# Patient Record
Sex: Male | Born: 1970 | Race: Black or African American | Hispanic: No | Marital: Single | Smoking: Former smoker
Health system: Southern US, Community
[De-identification: ages and names within clinical notes are randomized; demographics above are authoritative.]

## PROBLEM LIST (undated history)

## (undated) DIAGNOSIS — M109 Gout, unspecified: Secondary | ICD-10-CM

## (undated) DIAGNOSIS — N183 Chronic kidney disease, stage 3 unspecified: Secondary | ICD-10-CM

## (undated) DIAGNOSIS — E785 Hyperlipidemia, unspecified: Secondary | ICD-10-CM

## (undated) DIAGNOSIS — E119 Type 2 diabetes mellitus without complications: Secondary | ICD-10-CM

## (undated) DIAGNOSIS — I509 Heart failure, unspecified: Secondary | ICD-10-CM

## (undated) DIAGNOSIS — I10 Essential (primary) hypertension: Secondary | ICD-10-CM

---

## 2005-07-07 ENCOUNTER — Other Ambulatory Visit: Payer: Self-pay

## 2005-11-18 ENCOUNTER — Other Ambulatory Visit: Payer: Self-pay

## 2007-03-19 ENCOUNTER — Other Ambulatory Visit: Payer: Self-pay

## 2007-05-31 IMAGING — CR DG CHEST 1V PORT
1 series · 1 of 1 positions shown · non-contrast
Comparison: none

REASON FOR EXAM: Chest pain
COMMENTS:  LMP: (Male)

PROCEDURE:     DXR - DXR PORTABLE CHEST SINGLE VIEW  - July 07, 2005  [DATE]
RESULT:     AP view of the chest shows the lung fields to be clear. The
heart, mediastinal and osseous structures are normal in appearance.

[view not recorded]
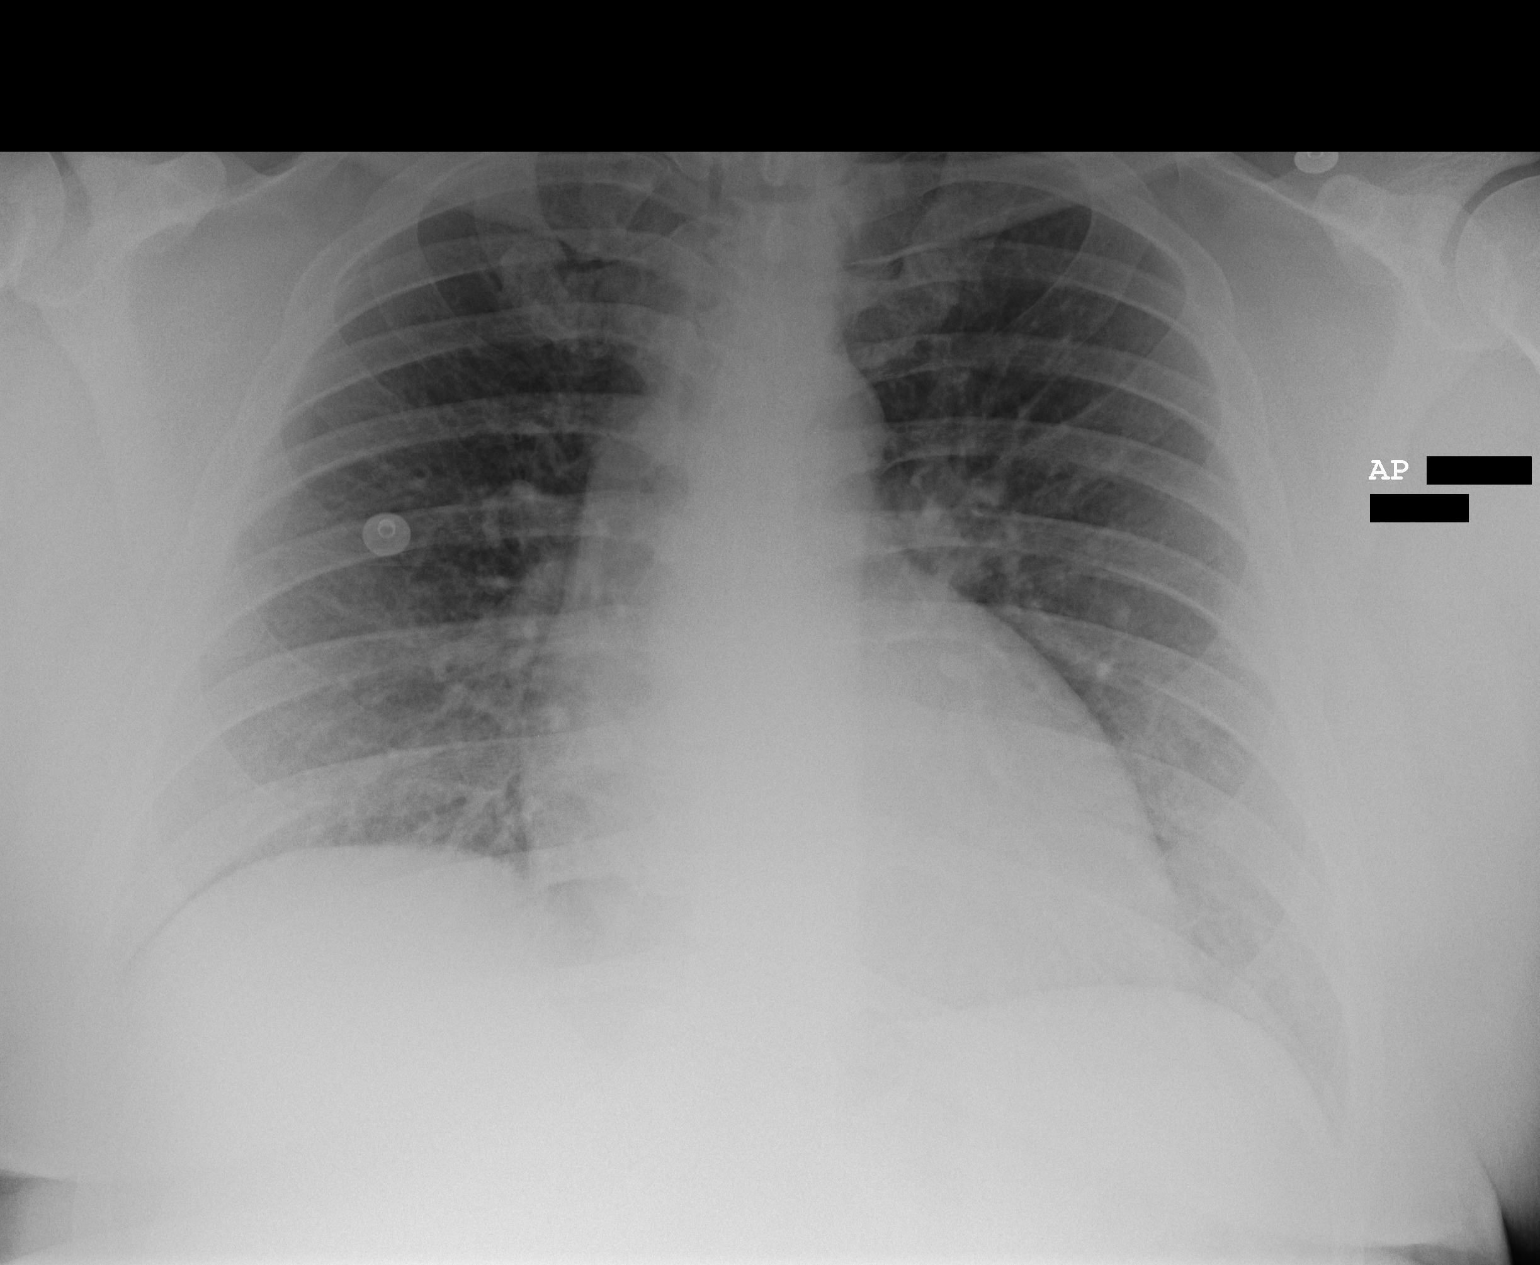

[1 of 1 positions shown; findings below may reference images not displayed]

IMPRESSION: No significant abnormalities are noted.

## 2007-11-14 ENCOUNTER — Other Ambulatory Visit: Payer: Self-pay

## 2008-01-21 ENCOUNTER — Other Ambulatory Visit: Payer: Self-pay

## 2009-07-01 ENCOUNTER — Inpatient Hospital Stay: Payer: Self-pay | Admitting: Internal Medicine

## 2009-08-04 ENCOUNTER — Ambulatory Visit: Payer: Self-pay | Admitting: Internal Medicine

## 2009-11-23 ENCOUNTER — Emergency Department: Payer: Self-pay | Admitting: Emergency Medicine

## 2010-03-25 ENCOUNTER — Emergency Department: Payer: Self-pay | Admitting: Emergency Medicine

## 2010-06-30 ENCOUNTER — Emergency Department: Payer: Self-pay | Admitting: Emergency Medicine

## 2010-12-23 ENCOUNTER — Emergency Department: Payer: Self-pay | Admitting: Emergency Medicine

## 2011-07-10 ENCOUNTER — Emergency Department: Payer: Self-pay | Admitting: *Deleted

## 2011-07-23 ENCOUNTER — Inpatient Hospital Stay: Payer: Self-pay | Admitting: Internal Medicine

## 2011-11-23 ENCOUNTER — Emergency Department: Payer: Self-pay | Admitting: Emergency Medicine

## 2011-12-18 ENCOUNTER — Observation Stay: Payer: Self-pay | Admitting: Internal Medicine

## 2012-01-24 ENCOUNTER — Emergency Department: Payer: Self-pay | Admitting: Emergency Medicine

## 2012-02-26 ENCOUNTER — Emergency Department: Payer: Self-pay | Admitting: Emergency Medicine

## 2012-08-21 ENCOUNTER — Emergency Department: Payer: Self-pay | Admitting: Emergency Medicine

## 2012-10-05 ENCOUNTER — Emergency Department: Payer: Self-pay | Admitting: Emergency Medicine

## 2012-12-31 ENCOUNTER — Inpatient Hospital Stay: Payer: Self-pay | Admitting: Internal Medicine

## 2013-01-09 ENCOUNTER — Emergency Department: Payer: Self-pay | Admitting: Emergency Medicine

## 2013-04-16 ENCOUNTER — Emergency Department: Payer: Self-pay | Admitting: Emergency Medicine

## 2013-07-08 ENCOUNTER — Inpatient Hospital Stay: Payer: Self-pay | Admitting: Internal Medicine

## 2013-09-04 ENCOUNTER — Emergency Department: Payer: Self-pay | Admitting: Emergency Medicine

## 2013-09-13 ENCOUNTER — Inpatient Hospital Stay: Payer: Self-pay | Admitting: Internal Medicine

## 2013-09-22 ENCOUNTER — Emergency Department: Payer: Self-pay | Admitting: Emergency Medicine

## 2013-09-22 ENCOUNTER — Inpatient Hospital Stay (HOSPITAL_COMMUNITY)
Admission: EM | Admit: 2013-09-22 | Discharge: 2013-09-23 | DRG: 682 | Disposition: A | Discharge status: Home / Self Care | Payer: BC Managed Care – PPO | Source: Other Acute Inpatient Hospital | Attending: Internal Medicine | Admitting: Internal Medicine

## 2013-09-22 ENCOUNTER — Encounter (HOSPITAL_COMMUNITY): Payer: Self-pay | Admitting: Internal Medicine

## 2013-09-22 DIAGNOSIS — I509 Heart failure, unspecified: Secondary | ICD-10-CM | POA: Diagnosis present

## 2013-09-22 DIAGNOSIS — N183 Chronic kidney disease, stage 3 unspecified: Secondary | ICD-10-CM | POA: Diagnosis present

## 2013-09-22 DIAGNOSIS — I161 Hypertensive emergency: Secondary | ICD-10-CM

## 2013-09-22 DIAGNOSIS — E785 Hyperlipidemia, unspecified: Secondary | ICD-10-CM | POA: Diagnosis present

## 2013-09-22 DIAGNOSIS — J9600 Acute respiratory failure, unspecified whether with hypoxia or hypercapnia: Secondary | ICD-10-CM | POA: Diagnosis present

## 2013-09-22 DIAGNOSIS — I129 Hypertensive chronic kidney disease with stage 1 through stage 4 chronic kidney disease, or unspecified chronic kidney disease: Principal | ICD-10-CM | POA: Diagnosis present

## 2013-09-22 DIAGNOSIS — Z91199 Patient's noncompliance with other medical treatment and regimen due to unspecified reason: Secondary | ICD-10-CM

## 2013-09-22 DIAGNOSIS — Z7982 Long term (current) use of aspirin: Secondary | ICD-10-CM

## 2013-09-22 DIAGNOSIS — Z9119 Patient's noncompliance with other medical treatment and regimen: Secondary | ICD-10-CM

## 2013-09-22 DIAGNOSIS — I10 Essential (primary) hypertension: Secondary | ICD-10-CM

## 2013-09-22 DIAGNOSIS — R0602 Shortness of breath: Secondary | ICD-10-CM

## 2013-09-22 DIAGNOSIS — E119 Type 2 diabetes mellitus without complications: Secondary | ICD-10-CM | POA: Diagnosis present

## 2013-09-22 DIAGNOSIS — M109 Gout, unspecified: Secondary | ICD-10-CM | POA: Diagnosis present

## 2013-09-22 DIAGNOSIS — Z794 Long term (current) use of insulin: Secondary | ICD-10-CM

## 2013-09-22 HISTORY — DX: Essential (primary) hypertension: I10

## 2013-09-22 HISTORY — DX: Chronic kidney disease, stage 3 (moderate): N18.3

## 2013-09-22 HISTORY — DX: Heart failure, unspecified: I50.9

## 2013-09-22 HISTORY — DX: Type 2 diabetes mellitus without complications: E11.9

## 2013-09-22 HISTORY — DX: Gout, unspecified: M10.9

## 2013-09-22 HISTORY — DX: Chronic kidney disease, stage 3 unspecified: N18.30

## 2013-09-22 HISTORY — DX: Hyperlipidemia, unspecified: E78.5

## 2013-09-22 LAB — URINALYSIS, ROUTINE W REFLEX MICROSCOPIC
Bilirubin Urine: NEGATIVE
Glucose, UA: 100 mg/dL — AB
Hgb urine dipstick: NEGATIVE
KETONES UR: NEGATIVE mg/dL
LEUKOCYTES UA: NEGATIVE
NITRITE: NEGATIVE
PH: 6 (ref 5.0–8.0)
Protein, ur: 30 mg/dL — AB
Specific Gravity, Urine: 1.01 (ref 1.005–1.030)
Urobilinogen, UA: 0.2 mg/dL (ref 0.0–1.0)

## 2013-09-22 LAB — TROPONIN I: Troponin I: <0.3 ng/mL (ref <0.30)

## 2013-09-22 LAB — GLUCOSE, CAPILLARY
GLUCOSE-CAPILLARY: 157 mg/dL — AB (ref 70–99)
Glucose-Capillary: 192 mg/dL — ABNORMAL HIGH (ref 70–99)
Glucose-Capillary: 159 mg/dL — ABNORMAL HIGH (ref 70–99)

## 2013-09-22 LAB — COMPREHENSIVE METABOLIC PANEL
ALT: 30 U/L (ref 0–53)
AST: 18 U/L (ref 0–37)
Albumin: 2.6 g/dL — ABNORMAL LOW (ref 3.5–5.2)
Alkaline Phosphatase: 84 U/L (ref 39–117)
BUN: 29 mg/dL — ABNORMAL HIGH (ref 6–23)
CHLORIDE: 98 meq/L (ref 96–112)
CO2: 20 mEq/L (ref 19–32)
Calcium: 8.9 mg/dL (ref 8.4–10.5)
Creatinine, Ser: 2.58 mg/dL — ABNORMAL HIGH (ref 0.50–1.35)
GFR calc Af Amer: 34 mL/min — ABNORMAL LOW (ref >90)
GFR calc non Af Amer: 29 mL/min — ABNORMAL LOW (ref >90)
Glucose, Bld: 220 mg/dL — ABNORMAL HIGH (ref 70–99)
Potassium: 3.8 mEq/L (ref 3.7–5.3)
SODIUM: 137 meq/L (ref 137–147)
TOTAL PROTEIN: 6.1 g/dL (ref 6.0–8.3)
Total Bilirubin: 0.3 mg/dL (ref 0.3–1.2)

## 2013-09-22 LAB — CBC
HCT: 42.6 % (ref 39.0–52.0)
Hemoglobin: 13.8 g/dL (ref 13.0–17.0)
MCH: 27.7 pg (ref 26.0–34.0)
MCHC: 32.4 g/dL (ref 30.0–36.0)
MCV: 85.4 fL (ref 78.0–100.0)
Platelets: 228 10*3/uL (ref 150–400)
RBC: 4.99 MIL/uL (ref 4.22–5.81)
RDW: 14.7 % (ref 11.5–15.5)
WBC: 7.3 10*3/uL (ref 4.0–10.5)

## 2013-09-22 LAB — RAPID URINE DRUG SCREEN, HOSP PERFORMED
Amphetamines: NOT DETECTED
BENZODIAZEPINES: NOT DETECTED
Barbiturates: NOT DETECTED
Cocaine: NOT DETECTED
OPIATES: NOT DETECTED
Tetrahydrocannabinol: NOT DETECTED

## 2013-09-22 LAB — URINE MICROSCOPIC-ADD ON

## 2013-09-22 LAB — PHOSPHORUS: Phosphorus: 3.8 mg/dL (ref 2.3–4.6)

## 2013-09-22 LAB — MAGNESIUM: Magnesium: 2.1 mg/dL (ref 1.5–2.5)

## 2013-09-22 LAB — MRSA PCR SCREENING: MRSA BY PCR: NEGATIVE

## 2013-09-22 MED ORDER — INSULIN ASPART 100 UNIT/ML ~~LOC~~ SOLN
13.0000 [IU] | Freq: Three times a day (TID) | SUBCUTANEOUS | Status: DC
  Administered 2013-09-22 – 2013-09-23 (×6): 13 [IU] via SUBCUTANEOUS

## 2013-09-22 MED ORDER — FUROSEMIDE 40 MG PO TABS
40.0000 mg | ORAL_TABLET | Freq: Every day | ORAL | Status: DC
  Administered 2013-09-22 – 2013-09-23 (×2): 40 mg via ORAL
  Filled 2013-09-22 (×2): qty 1

## 2013-09-22 MED ORDER — SODIUM CHLORIDE 0.9 % IV SOLN: 250.0000 mL | INTRAVENOUS | Status: DC | PRN

## 2013-09-22 MED ORDER — ISOSORBIDE MONONITRATE ER 30 MG PO TB24
30.0000 mg | ORAL_TABLET | Freq: Every day | ORAL | Status: DC
  Administered 2013-09-22 – 2013-09-23 (×2): 30 mg via ORAL
  Filled 2013-09-22 (×2): qty 1

## 2013-09-22 MED ORDER — ASPIRIN EC 81 MG PO TBEC
81.0000 mg | DELAYED_RELEASE_TABLET | Freq: Every day | ORAL | Status: DC
  Administered 2013-09-22 – 2013-09-23 (×2): 81 mg via ORAL
  Filled 2013-09-22 (×2): qty 1

## 2013-09-22 MED ORDER — CLONIDINE HCL 0.3 MG PO TABS: 0.3000 mg | ORAL_TABLET | Freq: Three times a day (TID) | ORAL | Status: DC

## 2013-09-22 MED ORDER — HYDRALAZINE HCL 20 MG/ML IJ SOLN
10.0000 mg | Freq: Four times a day (QID) | INTRAMUSCULAR | Status: DC | PRN
  Administered 2013-09-22 (×2): 10 mg via INTRAVENOUS
  Filled 2013-09-22 (×3): qty 1

## 2013-09-22 MED ORDER — LISINOPRIL 10 MG PO TABS: 10.0000 mg | ORAL_TABLET | Freq: Every day | ORAL | Status: DC

## 2013-09-22 MED ORDER — CLONIDINE HCL 0.3 MG PO TABS
0.3000 mg | ORAL_TABLET | Freq: Three times a day (TID) | ORAL | Status: DC
  Administered 2013-09-22 – 2013-09-23 (×5): 0.3 mg via ORAL
  Filled 2013-09-22 (×7): qty 1

## 2013-09-22 MED ORDER — LIVING WELL WITH DIABETES BOOK
Freq: Once | Status: AC
  Administered 2013-09-22: 17:00:00
  Filled 2013-09-22: qty 1

## 2013-09-22 MED ORDER — HEPARIN SODIUM (PORCINE) 5000 UNIT/ML IJ SOLN
5000.0000 [IU] | Freq: Three times a day (TID) | INTRAMUSCULAR | Status: DC
  Administered 2013-09-22 – 2013-09-23 (×4): 5000 [IU] via SUBCUTANEOUS
  Filled 2013-09-22 (×7): qty 1

## 2013-09-22 MED ORDER — SIMVASTATIN 40 MG PO TABS
40.0000 mg | ORAL_TABLET | Freq: Every day | ORAL | Status: DC
  Administered 2013-09-23: 40 mg via ORAL
  Filled 2013-09-22 (×2): qty 1

## 2013-09-22 MED ORDER — HYDRALAZINE HCL 20 MG/ML IJ SOLN
10.0000 mg | INTRAMUSCULAR | Status: AC
  Administered 2013-09-22: 10 mg via INTRAVENOUS

## 2013-09-22 MED ORDER — INSULIN DETEMIR 100 UNIT/ML ~~LOC~~ SOLN
20.0000 [IU] | Freq: Every day | SUBCUTANEOUS | Status: DC
  Administered 2013-09-22: 20 [IU] via SUBCUTANEOUS
  Filled 2013-09-22 (×3): qty 0.2

## 2013-09-22 MED ORDER — ACETAMINOPHEN 325 MG PO TABS
650.0000 mg | ORAL_TABLET | Freq: Four times a day (QID) | ORAL | Status: DC | PRN
  Administered 2013-09-22 – 2013-09-23 (×2): 650 mg via ORAL
  Filled 2013-09-22 (×2): qty 2

## 2013-09-22 MED ORDER — HYDRALAZINE HCL 25 MG PO TABS
25.0000 mg | ORAL_TABLET | Freq: Three times a day (TID) | ORAL | Status: DC
  Administered 2013-09-22 – 2013-09-23 (×5): 25 mg via ORAL
  Filled 2013-09-22 (×7): qty 1

## 2013-09-22 MED ORDER — HYDRALAZINE HCL 25 MG PO TABS: 25.0000 mg | ORAL_TABLET | Freq: Three times a day (TID) | ORAL | Status: DC

## 2013-09-22 NOTE — Progress Notes (Signed)
Pt arrived by [facility] on BiPap 16/8, 40%. Pt removed from BiPap and placed on 2L [place]. Sats 100%. Pt states he feels much better. No increased WOB. Pt understands to call RN/RT if he feels worse.

## 2013-09-22 NOTE — H&P (Addendum)
PULMONARY / CRITICAL CARE MEDICINE   Name: Sean Buck MRN: [id number] DOB: 1970-11-01    ADMISSION DATE:  09/22/2013 PRIMARY SERVICE: PCCM  CHIEF COMPLAINT:  Dyspnea  BRIEF PATIENT DESCRIPTION: 37 M with CKD and h/o hypertensive emergency who presented to [facility] with hypertensive emergency. Initially required NTG drip and BiPAP but off both time of transfer.  SIGNIFICANT EVENTS / STUDIES:  Transferred to [facility] 4/5  LINES / TUBES: PIV  CULTURES: None  ANTIBIOTICS: None  HISTORY OF PRESENT ILLNESS:  Sean Buck is a 43 yo M with HTN with prior episodes of hypertensive emergency, "CHF" (per [facility] notes, details such as EF not available), CKD, and DM who presented to [facility] with the abrupt onset of shortness of breath on 4/4. He was found to be significantly hypertensive on arrival and required BiPAP to maintain his saturations initially. He first noticed SOB at work on Sat. The experience reminded him of prior episodes of hypertensive emergency and he called 911. He was just discharged from [facility] last Sunday for Hypertensive Emergency. Following discharge he never felt "right" but was able to work this past week. He reports compliance with his antihypertensive regimen since discharge. He denies F/cough/HA/worsened LE edema prior to the onset of SOB. He currently feels much better than prior to calling 911.  PAST MEDICAL HISTORY :  Past Medical History  Diagnosis Date  . 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 CHF (congestive heart failure)     Chart Hx per [facility], EF unknown  . Gout   . Diabetes   . Chronic kidney disease (CKD), stage III (moderate)   . Hyperlipidemia    History reviewed. No pertinent past surgical history. Prior to Admission medications   Medication Sig Start Date End Date Taking? Authorizing Provider  aspirin 81 MG tablet Take 81 mg by mouth daily.   Yes Historical  Provider, MD  cloNIDine (CATAPRES) 0.3 MG tablet Take 0.3 mg by mouth 2 (two) times daily.   Yes Historical Provider, MD  diazepam (VALIUM) 5 MG tablet  09/06/13   Historical Provider, MD  furosemide (LASIX) 40 MG tablet  09/14/13   Historical Provider, MD  hydrALAZINE (APRESOLINE) 25 MG tablet  09/15/13   Historical Provider, MD  isosorbide mononitrate (IMDUR) 30 MG 24 hr tablet  09/15/13   Historical Provider, MD  LEVEMIR FLEXTOUCH 100 UNIT/ML Pen  09/15/13   Historical Provider, MD  metoprolol tartrate (LOPRESSOR) 25 MG tablet  09/15/13   Historical Provider, MD  NOVOLOG FLEXPEN 100 UNIT/ML FlexPen  09/14/13   Historical Provider, MD  predniSONE (DELTASONE) 10 MG tablet  08/01/13   Historical Provider, MD  simvastatin (ZOCOR) 40 MG tablet  09/15/13   Historical Provider, MD   No Known Allergies  FAMILY HISTORY:  Family History  Problem Relation Age of Onset  . Hypertension Mother   . Hypertension Father    SOCIAL HISTORY:  reports that he has never smoked. He has never used smokeless tobacco. He reports that he does not drink alcohol or use illicit drugs.  REVIEW OF SYSTEMS:  A 12-system ROS was conducted and, unless otherwise specified in the HPI, was negative.   SUBJECTIVE:   VITAL SIGNS:   HEMODYNAMICS:   VENTILATOR SETTINGS:   INTAKE / OUTPUT: Intake/Output   None     PHYSICAL EXAMINATION: General:  Obese M in NAD Neuro:  Intact HEENT:  Sclera anicteric, conjunctiva pink, MMM, OP Clear Neck:  Unable to see JVD 2/2 presence of extensive facial hair Cardiovascular:  RRR, NS1/S2, (-) MRG Lungs:  CTAB Abdomen:  S/Obese/NT/ND Musculoskeletal:  (-) C/C, 1+ edema in lower 1/3 shins Skin:  (-) Rash  LABS:  Reviewed transfer labs. Notable for:  Troponin of 0.07 Glucose of 276 Cr 3.32 Cr 3.29 on DC 3/29  CBC No results found for this basename: WBC, HGB, HCT, PLT,  in the last 168 hours Coag's No results found for this basename: APTT, INR,  in the last 168  hours BMET No results found for this basename: NA, K, CL, CO2, BUN, CREATININE, GLUCOSE,  in the last 168 hours Electrolytes No results found for this basename: CALCIUM, MG, PHOS,  in the last 168 hours Sepsis Markers No results found for this basename: LATICACIDVEN, PROCALCITON, O2SATVEN,  in the last 168 hours ABG No results found for this basename: PHART, PCO2ART, PO2ART,  in the last 168 hours Liver Enzymes No results found for this basename: AST, ALT, ALKPHOS, BILITOT, ALBUMIN,  in the last 168 hours Cardiac Enzymes No results found for this basename: TROPONINI, PROBNP,  in the last 168 hours Glucose No results found for this basename: GLUCAP,  in the last 168 hours  Imaging No results found.  EKG: Review[facility] from [facility]. Biatrial enlargement. LAD. Poor R-wave progression. RBBB. (-) Acute ST changes.  CXR: CXR from [facility] was personally reviewed. There is edema with mild cardiomegaly.  ASSESSMENT / PLAN:  PULMONARY A: Acute Hypoxic Respiratory Failure: Almost certainly 2/2 HTNive Emergency Induced Acute Heart Failure. Markedly improved as currently satting 98% on 3L [place]. P:   Supplemental O2 to maintain Sats >=92%  CARDIOVASCULAR A: Hypertensive Emergency: Unclear etiology. Patient indicates that he is compliant with meds. There are minimal records available to review to corroborate. He may benefit from a HTN specialist if he truly is complaint and keeps having episodes. Acute on Chronic CHF: Appears nearly resolved. Unclear baseline EF, etc. No urgent indication to obtain repeat echo [facility]. HL P:  UDS Continue OP medicine regimen save for change of clonidine to tid from bid PRN Hydralazine as initial measure to bring SBP to 180 and DBP to 100 AM team to try to obtain old echocardiogram results Consider HTN specialist referral at DC  RENAL A:  CKD: Likely at baseline Cr based on results from [place] P:   Serial BMPs  GASTROINTESTINAL A:   No Acute Issue  HEMATOLOGIC A:  No Acute Issue  INFECTIOUS A:  No Acute Issue  ENDOCRINE A:  DM: Given lack of data for OP medicine regimen compliance will start at 1/2 dose home regimen and escalate as needed. P:   1/2 dose OP regimen  NEUROLOGIC A:  No Acute Issue  TODAY'S SUMMARY:   I have personally obtained a history, examined the patient, evaluated laboratory and imaging results, formulated the assessment and plan and placed orders. CRITICAL CARE: The patient is critically ill with multiple organ systems failure and requires high complexity decision making for assessment and support, frequent evaluation and titration of therapies, application of advanced monitoring technologies and extensive interpretation of multiple databases. Critical Care Time devoted to patient care services described in this note is 25 minutes.    Pulmonary and Critical Care Medicine [facility] Pager: [phone]  Evalyn CascoAdam Astha Probasco, MD  09/22/2013, 5:43 AM

## 2013-09-22 NOTE — Progress Notes (Signed)
PULMONARY / CRITICAL CARE MEDICINE   Name: Sean Buck MRN: [id number] DOB: 09/17/1970    ADMISSION DATE:  09/22/2013 PRIMARY SERVICE: PCCM  CHIEF COMPLAINT:  Dyspnea  BRIEF PATIENT DESCRIPTION: [age] M with CKD and h/o hypertensive emergency who [facility] hypertensive emergency. Initially required NTG drip and BiPAP but off both time of transfer.  SIGNIFICANT EVENTS / STUDIES:  [facility]  LINES / TUBES: PIV  CULTURES: None  ANTIBIOTICS: None  SUBJECTIVE: Feels much better this AM.  VITAL SIGNS: Temp:  [98.1 F (36.7 C)-98.4 F (36.9 C)] 98.1 F (36.7 C) (04/05 0800) Pulse Rate:  [60-85] 70 (04/05 1100) Resp:  [13-18] 18 (04/05 0800) BP: (162-213)/(92-159) 176/114 mmHg (04/05 1100) SpO2:  [97 %-100 %] 99 % (04/05 1100) Weight:  [316 lb 9.3 oz (143.6 kg)] 316 lb 9.3 oz (143.6 kg) (04/05 0510) HEMODYNAMICS:   VENTILATOR SETTINGS:   INTAKE / OUTPUT: Intake/Output     04/04 0701 - 04/05 0700 04/05 0701 - 04/06 0700   P.O.  360   Total Intake(mL/kg)  360 (2.5)   Urine (mL/kg/hr) 700 700 (1.1)   Total Output 700 700   Net -700 -340        Urine Occurrence 1 x      PHYSICAL EXAMINATION: General:  Obese M in NAD Neuro:  Intact HEENT:  Sclera anicteric, conjunctiva pink, MMM, OP Clear Neck: Unable to see JVD 2/2 presence of extensive facial hair Cardiovascular:  RRR, NS1/S2, (-) MRG Lungs:  CTAB Abdomen:  S/Obese/NT/ND Musculoskeletal:  (-) C/C, 1+ edema in lower 1/3 shins Skin:  (-) Rash  LABS:  Reviewed transfer labs. Notable for:  Troponin of 0.07 Glucose of 276 Cr 3.32 Cr 3.29 on DC 3/29  CBC No results found for this basename: WBC, HGB, HCT, PLT,  in the last 168 hours Coag's No results found for this basename: APTT, INR,  in the last 168 hours BMET No results found for this basename: NA, K, CL, CO2, BUN, CREATININE, GLUCOSE,  in the last 168 hours Electrolytes No results found for this basename: CALCIUM,  MG, PHOS,  in the last 168 hours Sepsis Markers No results found for this basename: LATICACIDVEN, PROCALCITON, O2SATVEN,  in the last 168 hours ABG No results found for this basename: PHART, PCO2ART, PO2ART,  in the last 168 hours Liver Enzymes No results found for this basename: AST, ALT, ALKPHOS, BILITOT, ALBUMIN,  in the last 168 hours Cardiac Enzymes No results found for this basename: TROPONINI, PROBNP,  in the last 168 hours Glucose No results found for this basename: GLUCAP,  in the last 168 hours  Imaging No results found.  EKG: Reviewed from [facility]. Biatrial enlargement. LAD. Poor R-wave progression. RBBB. (-) Acute ST changes.  CXR: CX[facility] was personally reviewed. There is edema with mild cardiomegaly.  ASSESSMENT / PLAN:  PULMONARY A: Acute Hypoxic Respiratory Failure: Almost certainly 2/2 HTNive Emergency Induced Acute Heart Failure. Markedly improved as currently satting 98% on 3L [place]. P:   Supplemental O2 to maintain Sats 88-92%.  CARDIOVASCULAR A: Hypertensive Emergency: Unclear etiology. Patient indicates that he is compliant with meds. There are minimal records available to review to corroborate. He may benefit from a HTN specialist if he truly is complaint and keeps having episodes. Acute on Chronic CHF: Appears nearly resolved. Unclear baseline EF, etc. No urgent indication to obtain repeat echo [facility]. HL P:  - UDS pending. - Continue OP medicine regiment  -  PRN Hydralazine as initial measure to bring SBP to 160 and DBP to 80 - 2D echo ordered. - Consider HTN specialist referral at DC.  RENAL A:  CKD: Likely at baseline Cr based on results from [place] P:   - Serial BMPs. - Replace electrolytes as indicated.  GASTROINTESTINAL A:  No Acute Issue  HEMATOLOGIC A:  No Acute Issue  INFECTIOUS A:  No Acute Issue  ENDOCRINE A:  DM: Given lack of data for OP medicine regimen compliance will start at 1/2 dose home  regimen and escalate as needed. P:   - 1/2 dose OP regimen. - Consult diabetic educator, patient has no insight into his disease.  NEUROLOGIC A:  No Acute Issue  TODAY'S SUMMARY: Transfer to tele and to [facility] service, diabetic educator to see patient.  I have personally obtained a history, examined the patient, evaluated laboratory and imaging results, formulated the assessment and plan and placed orders.  Alyson Reedy, M.D. [facility] Pulmonary/Critical Care Medicine. Pager: [phone]. After hours pager: [phone].

## 2013-09-22 NOTE — Progress Notes (Signed)
Inpatient Diabetes Program Recommendations  AACE/ADA: New Consensus Statement on Inpatient Glycemic Control (2013)  Target Ranges:  Prepandial:   less than 140 mg/dL      Peak postprandial:   less than 180 mg/dL (1-2 hours)      Critically ill patients:  140 - 180 mg/dL    Referral received re 'diet, CHF/Gout/ DM'. Pt in cath lab presently.Need a HgbA1C to assess glucose control prior to admission Will order ed. Per bedside RN for ed'l videos, mosby notes, RD consult. Will follow up on Monday. Thank you, Sean CoffinAnn Kashaun Bebo, RN, CNS, Diabetes Coordinator [phone])

## 2013-09-23 DIAGNOSIS — J9600 Acute respiratory failure, unspecified whether with hypoxia or hypercapnia: Secondary | ICD-10-CM

## 2013-09-23 DIAGNOSIS — I517 Cardiomegaly: Secondary | ICD-10-CM

## 2013-09-23 DIAGNOSIS — I129 Hypertensive chronic kidney disease with stage 1 through stage 4 chronic kidney disease, or unspecified chronic kidney disease: Secondary | ICD-10-CM

## 2013-09-23 DIAGNOSIS — N183 Chronic kidney disease, stage 3 unspecified: Secondary | ICD-10-CM

## 2013-09-23 DIAGNOSIS — N189 Chronic kidney disease, unspecified: Secondary | ICD-10-CM

## 2013-09-23 LAB — CBC
HCT: 40.6 % (ref 39.0–52.0)
Hemoglobin: 13.1 g/dL (ref 13.0–17.0)
MCH: 27.7 pg (ref 26.0–34.0)
MCHC: 32.3 g/dL (ref 30.0–36.0)
MCV: 85.8 fL (ref 78.0–100.0)
PLATELETS: 217 10*3/uL (ref 150–400)
RBC: 4.73 MIL/uL (ref 4.22–5.81)
RDW: 14.8 % (ref 11.5–15.5)
WBC: 6.2 10*3/uL (ref 4.0–10.5)

## 2013-09-23 LAB — GLUCOSE, CAPILLARY
GLUCOSE-CAPILLARY: 232 mg/dL — AB (ref 70–99)
GLUCOSE-CAPILLARY: 133 mg/dL — AB (ref 70–99)
GLUCOSE-CAPILLARY: 130 mg/dL — AB (ref 70–99)
Glucose-Capillary: 165 mg/dL — ABNORMAL HIGH (ref 70–99)

## 2013-09-23 LAB — TSH: TSH: 1.32 u[IU]/mL (ref 0.350–4.500)

## 2013-09-23 LAB — HEMOGLOBIN A1C
Hgb A1c MFr Bld: 13.2 % — ABNORMAL HIGH (ref <5.7)
Mean Plasma Glucose: 332 mg/dL — ABNORMAL HIGH (ref <117)

## 2013-09-23 LAB — BASIC METABOLIC PANEL
BUN: 30 mg/dL — ABNORMAL HIGH (ref 6–23)
CALCIUM: 8.9 mg/dL (ref 8.4–10.5)
CO2: 27 meq/L (ref 19–32)
CREATININE: 2.76 mg/dL — AB (ref 0.50–1.35)
Chloride: 99 mEq/L (ref 96–112)
GFR calc Af Amer: 31 mL/min — ABNORMAL LOW (ref >90)
GFR calc non Af Amer: 27 mL/min — ABNORMAL LOW (ref >90)
Glucose, Bld: 165 mg/dL — ABNORMAL HIGH (ref 70–99)
Potassium: 3.7 mEq/L (ref 3.7–5.3)
Sodium: 140 mEq/L (ref 137–147)

## 2013-09-23 LAB — MAGNESIUM: MAGNESIUM: 2.1 mg/dL (ref 1.5–2.5)

## 2013-09-23 LAB — PHOSPHORUS: Phosphorus: 3.7 mg/dL (ref 2.3–4.6)

## 2013-09-23 MED ORDER — CLONIDINE HCL 0.3 MG PO TABS: 0.3000 mg | ORAL_TABLET | Freq: Two times a day (BID) | ORAL | Status: DC

## 2013-09-23 MED ORDER — UNABLE TO FIND: Status: DC

## 2013-09-23 NOTE — Progress Notes (Signed)
Physical Therapy Discharge Patient Details Name: Sean Buck MRN: [id number] DOB: April 07, 1971 Today's Date: 09/23/2013 Time:  -     Patient discharged from PT services secondary to pt relates that mobility is not a problem and if that was the only thing wrong he would be gone.  He declines therapy so will sign off at this time..   09/23/2013  Sean Buck, PT [phone]  (pager)  GP     Sean Buck, Sean Buck 09/23/2013, 4:01 PM

## 2013-09-23 NOTE — Progress Notes (Signed)
Reviewed discharge instructions with patient and his family questions answered via teachback. DC home with belongings , instructions.

## 2013-09-23 NOTE — Progress Notes (Signed)
  Echocardiogram 2D Echocardiogram has been performed.  Arvil ChacoFoster, Mica Releford 09/23/2013, 2:07 PM

## 2013-09-23 NOTE — Progress Notes (Signed)
Heart Failure Navigator Consult Note  Presentation: Sean Buck is a 43 yo M with HTN with prior episodes of hypertensive emergency, "CHF" (per [facility] notes, details such as EF not available), CKD, and DM who presented to [facility] with the abrupt onset of shortness of breath on 4/4. He was found to be significantly hypertensive on arrival and required BiPAP to maintain his saturations initially. He first noticed SOB at work on Sat. The experience reminded him of prior episodes of hypertensive emergency and he called 911. He was just discharged from [facility] last Sunday for Hypertensive Emergency. Following discharge he never felt "right" but was able to work this past week. He reports compliance with his antihypertensive regimen since discharge. He denies F/cough/HA/worsened LE edema prior to the onset of SOB. He currently feels much better than prior to calling 911.    Past Medical History  Diagnosis Date  . 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 CHF (congestive heart failure)     Chart Hx per [facility], EF unknown  . Gout   . Diabetes   . Chronic kidney disease (CKD), stage III (moderate)   . Hyperlipidemia     History   Social History  . Marital Status: Single    Spouse Name: N/A    Number of Children: N/A  . Years of Education: N/A   Social History Main Topics  . Smoking status: Never Smoker   . Smokeless tobacco: Never Used  . Alcohol Use: No  . Drug Use: No  . Sexual Activity: No   Other Topics Concern  . None   Social History Narrative  . None    ECHO:Study Conclusions--  09/23/2013  - Left ventricle: The cavity size was mildly dilated. Wall thickness was increased in a pattern of moderate LVH. Systolic function was normal. The estimated ejection fraction was in the range of 50% to 55%. Regional wall motion abnormalities cannot be excluded. Findings consistent with left ventricular  diastolic dysfunction. - Left atrium: The atrium was moderately dilated. - Right ventricle: The cavity size was normal. Systolic function was normal.    BNP No results found for this basename: probnp    Education Assessment and Provision:  Detailed education and instructions provided on heart failure disease management including the following:  Signs and symptoms of Heart Failure When to call the physician Importance of daily weights Low sodium diet  Fluid restriction Medication management Anticipated future follow-up appointments  Patient education given on each of the above topics.  Patient and mother acknowledge understanding and acceptance of all instructions.  He is particularly reluctant related to his diet.  He explains that he likes to eat salads.  He adds olives and banana peppers to his salads.  We discussed low sodium options and ways to avoid sodium in his diet.  He is very frustrated and verbalizes "What am I going to eat?"  He avoids meat due to gout and has been told to count carbohydrates because of his diabetes and is now being asked to limit sodium intake.  I have asked dietician to consult and possibly provide some sample meals incorporating all recommendations.  Education Materials:  "Living Better With Heart Failure" Booklet, Daily Weight Tracker Tool and Heart Failure Educational Video.   High Risk Criteria for Readmission and/or Poor Patient Outcomes:   EF <30%- No --50-55%  2 or more admissions in 6 months- No  Difficult social situation- No  Demonstrates  medication noncompliance- No--however does say that new medications have been making him have ringing in his "ears" and "swimmy headed".  He says that he "has to be able to work".     Barriers of Care: Knowledge related to his medical conditions and ability/desire to be compliant   Discharge Planning:  Plans to discharge to home with mother.   He will need close follow- up out patient to reinforce  education and compliance.

## 2013-09-23 NOTE — Progress Notes (Signed)
Utilization Review Completed.Jahnaya Branscome T4/11/2013  

## 2013-09-23 NOTE — Discharge Summary (Addendum)
Physician Discharge Summary  Sean Buck ZOX:[id number] DOB: Jun 12, 1971 DOA: 09/22/2013  PCP: Luna Fuse, MD  Admit date: 09/22/2013 Discharge date: 09/23/2013  Recommendations for Outpatient Follow-up:  1. Pt will need to follow up with PCP in 2 weeks post discharge 2. Please obtain BMP to evaluate electrolytes and kidney function 3. Please also check CBC to evaluate Hg and Hct levels 4. Follow up with nephrology--Dr. Kollaru--09/30/13  Discharge Diagnoses:  Active Problems:   Hypertensive emergency   CKD (chronic kidney disease) stage 3, GFR 30-59 ml/min   Hypertensive nephropathy   Acute respiratory failure  hypertensive emergency -Patient presented with acute respiratory failure and flash pulmonary edema -This is likely due to noncompliance with his antihypertensive medications contrary to pt's claims -I have called to patient's pharmacy and verified large gaps in the patient's refills of his antihypertensive regimen -As a result, I would not undertake an extensive workup at this time until there is documented compliance -The patient's hypertensive emergency likely reflects rebound hypertension from the patient's noncompliance with clonidine -Remarkably, the patient's blood pressure has been controlled during his stay in the hospital -As a result, I will not change any of his anti-HTN at this time--he was instructed to continue clonidine, metoprolol, imdur, and lasix and hydralazine -Patient remains clinically stable on room air without any respiratory distress - patient will be discharged in stable condition to follow up with his nephrologist in [facility], Dr. Chinita Pester, with which the patient has an appointment on 09/30/2013 -Urine drug screen was negative  Acute respiratory failure -Secondary to flash pulmonary edema from hypertensive emergency -Resolved -Clinically stable on room air -Echocardiogram showed EF 50-55%, positive diastolic  dysfunction -He will continue furosemide 40 mg daily after discharge -appt will be made with cardiology, Dr. Elease Hashimoto for appt in [place] for follow up on 09/30/13 CKD stage III -Secondary to hypertensive nephropathy due to noncompliance -Serum creatinine appears to be at baseline -Serum creatinine 2.58 on the day of discharge -Patient will followup in nephrology -Please followup on serum aldosterone/renin ratio -TSH 1.320  -Patient had serum creatinine of 3.32 [place] [facility] Diabetes mellitus type 2 -CBG is fairly controlled during the hospitalization -Hemoglobin A1c 13.2 -Patient will resume his home diabetic regimen  -suspect noncompliance Hyperlipidemia  -Continue statin  Discharge Condition: Stable  Disposition: Home  Diet: Heart healthy  Wt Readings from Last 3 Encounters:  09/23/13 140.343 kg (309 lb 6.4 oz)    History of present illness:   43 year old male with a history of diabetes mellitus, hypertension presented with acute respiratory failure to [facility]. The patient was hypoxemic and required BiPAP for short period of time. The patient was transferred to [facility] for definitive care. He was initially placed on a nitroglycerin drip but was weaned off prior to his arrival. The patient's blood pressure was controlled with the initiation of hydralazine, Imdur, and continuation of his clonidine and metoprolol tartrate. The patient's respiratory status improved and he was stable on room. He was transferred to the medical floor. The patient remained clinically stable. Discussion with the patient's pharmacy revealed noncompliance with his antihypertensive regimen. His pharmacy verified that there were large gaps in the patient's refill of his antihypertensive regimen. As a result, further diagnostic workup of his uncontrolled hypertension will be held at this time pending future verification of the patient's compliance in the future. The patient will  followup with his nephrologist in [facility]   Consultants: Pulmonary critical care  Discharge Exam: Filed Vitals:  09/23/13 1431  BP: 155/97  Pulse: 68  Temp: 98.1 F (36.7 C)  Resp: 20   Filed Vitals:   09/22/13 2348 09/23/13 0439 09/23/13 1121 09/23/13 1431  BP: 135/81 133/84 135/94 155/97  Pulse:  62  68  Temp:  98.2 F (36.8 C)  98.1 F (36.7 C)  TempSrc:  Oral  Oral  Resp:  20  20  Height:      Weight:  140.343 kg (309 lb 6.4 oz)    SpO2:  97%  97%   General: A&O x 3, NAD, pleasant, cooperative Cardiovascular: RRR, no rub, no gallop, no S3 Respiratory: CTAB, no wheeze, no rhonchi Abdomen:soft, nontender, nondistended, positive bowel sounds Extremities: No edema, No lymphangitis, no petechiae  Discharge Instructions       Future Appointments Provider Department Dept Phone   09/30/2013 1:45 PM Guy Begin [facility] [phone]       Medication List    STOP taking these medications       predniSONE 10 MG tablet  Commonly known as:  DELTASONE      TAKE these medications       aspirin 81 MG tablet  Take 81 mg by mouth daily.     cloNIDine 0.3 MG tablet  Commonly known as:  CATAPRES  Take 1 tablet (0.3 mg total) by mouth 2 (two) times daily.     diazepam 5 MG tablet  Commonly known as:  VALIUM  Take 5 mg by mouth every 6 (six) hours as needed for muscle spasms.     furosemide 40 MG tablet  Commonly known as:  LASIX  Take 40 mg by mouth daily.     hydrALAZINE 25 MG tablet  Commonly known as:  APRESOLINE  Take 25 mg by mouth every 8 (eight) hours.     isosorbide mononitrate 30 MG 24 hr tablet  Commonly known as:  IMDUR  Take 30 mg by mouth daily.     LEVEMIR FLEXTOUCH 100 UNIT/ML Pen  Generic drug:  Insulin Detemir  Inject 40 Units into the skin at bedtime.     Magnesium 500 MG Caps  Take 500 mg by mouth daily.     metoprolol tartrate 25 MG tablet  Commonly known as:  LOPRESSOR  Take 25 mg by mouth 2  (two) times daily.     NOVOLOG FLEXPEN 100 UNIT/ML FlexPen  Generic drug:  insulin aspart  Inject 26 Units into the skin 3 (three) times daily with meals.     simvastatin 40 MG tablet  Commonly known as:  ZOCOR  Take 40 mg by mouth at bedtime.     UNABLE TO FIND  Mr. Bretado was admitted to the hospital from 09/22/13-09/23/13.         The results of significant diagnostics from this hospitalization (including imaging, microbiology, ancillary and laboratory) are listed below for reference.    Significant Diagnostic Studies: No results found.   Microbiology: Recent Results (from the past 240 hour(s))  MRSA PCR SCREENING     Status: None   Collection Time    09/22/13  6:12 AM      Result Value Ref Range Status   MRSA by PCR NEGATIVE  NEGATIVE Final   Comment:            The GeneXpert MRSA Assay (FDA     approved for NASAL specimens     only), is one component of a     comprehensive MRSA colonization  surveillance program. It is not     intended to diagnose MRSA     infection nor to guide or     monitor treatment for     MRSA infections.     Labs: Basic Metabolic Panel:  Recent Labs Lab 09/22/13 1240 09/23/13 0555  NA 137 140  K 3.8 3.7  CL 98 99  CO2 20 27  GLUCOSE 220* 165*  BUN 29* 30*  CREATININE 2.58* 2.76*  CALCIUM 8.9 8.9  MG 2.1 2.1  PHOS 3.8 3.7   Liver Function Tests:  Recent Labs Lab 09/22/13 1240  AST 18  ALT 30  ALKPHOS 84  BILITOT 0.3  PROT 6.1  ALBUMIN 2.6*   No results found for this basename: LIPASE, AMYLASE,  in the last 168 hours No results found for this basename: AMMONIA,  in the last 168 hours CBC:  Recent Labs Lab 09/22/13 1240 09/23/13 0555  WBC 7.3 6.2  HGB 13.8 13.1  HCT 42.6 40.6  MCV 85.4 85.8  PLT 228 217   Cardiac Enzymes:  Recent Labs Lab 09/22/13 1240 09/22/13 1850  TROPONINI <0.30 <0.30   BNP: No components found with this basename: POCBNP,  CBG:  Recent Labs Lab 09/22/13 1610  09/22/13 2113 09/23/13 0553 09/23/13 1134 09/23/13 1622  GLUCAP 192* 159* 165* 133* 130*    Time coordinating discharge:  Greater than 30 minutes  Signed:  Witten Certain, DO [facility] Pager: [phone] 09/23/2013, 5:59 PM

## 2013-09-23 NOTE — Plan of Care (Signed)
Problem: Limited Adherence to Nutrition-Related Recommendations (NB-1.6) Goal: Nutrition education Formal process to instruct or train a patient/client in a skill or to impart knowledge to help patients/clients voluntarily manage or modify food choices and eating behavior to maintain or improve health. Outcome: Completed/Met Date Met:  09/23/13  RD consulted for nutrition education regarding diabetes, CHF and gout.  RD provided "Carbohydrate Counting for People with Diabetes" & "Low Sodium Nutrition Therapy" handouts from the Academy of Nutrition and Dietetics. Discussed different food groups and their effects on blood sugar, emphasizing carbohydrate-containing foods. Provided list of carbohydrates and recommended serving sizes of common foods.   Provided examples on ways to decrease sodium intake in diet. Discouraged intake of processed foods and use of salt shaker.  Teach back method used.  Expect fair compliance.  Body mass index is 45.67 kg/(m^2). Pt meets criteria for Obesity Class III based on current BMI.  Current diet order is Carbohydrate Modified/HH, patient is consuming approximately 100% of meals at this time. Labs and medications reviewed. No further nutrition interventions warranted at this time. If additional nutrition issues arise, please re-consult RD.  Arthur Holms, RD, LDN Pager #: [phone] After-Hours Pager #: [phone]

## 2013-09-24 ENCOUNTER — Telehealth: Payer: Self-pay | Admitting: *Deleted

## 2013-09-24 NOTE — Telephone Encounter (Signed)
Patient contacted [facility].  Patient understands to follow up with provider Alinda Moneyony on 09/30/13 at 1:45 PM at [facility]. Patient understands discharge instructions? yes Patient understands medications and regiment? yes Patient understands to bring all medications to this visit? yes

## 2013-09-30 ENCOUNTER — Encounter: Payer: BC Managed Care – PPO | Admitting: Cardiovascular Disease

## 2013-09-30 ENCOUNTER — Encounter: Payer: Self-pay | Admitting: *Deleted

## 2013-10-02 LAB — ALDOSTERONE + RENIN ACTIVITY W/ RATIO
ALDO / PRA Ratio: 21.4 Ratio (ref 0.9–28.9)
Aldosterone: 12 ng/dL
PRA LC/MS/MS: 0.84 ng/mL/h (ref 0.25–5.82)

## 2013-10-05 ENCOUNTER — Emergency Department: Payer: Self-pay | Admitting: Emergency Medicine

## 2013-10-30 ENCOUNTER — Encounter: Payer: Self-pay | Admitting: Cardiovascular Disease

## 2013-10-30 ENCOUNTER — Ambulatory Visit (INDEPENDENT_AMBULATORY_CARE_PROVIDER_SITE_OTHER): Payer: BC Managed Care – PPO | Admitting: Cardiovascular Disease

## 2013-10-30 VITALS — BP 184/116 | HR 73 | Ht 69.0 in | Wt 315.5 lb

## 2013-10-30 DIAGNOSIS — IMO0002 Reserved for concepts with insufficient information to code with codable children: Secondary | ICD-10-CM

## 2013-10-30 DIAGNOSIS — I129 Hypertensive chronic kidney disease with stage 1 through stage 4 chronic kidney disease, or unspecified chronic kidney disease: Secondary | ICD-10-CM

## 2013-10-30 DIAGNOSIS — I10 Essential (primary) hypertension: Secondary | ICD-10-CM | POA: Insufficient documentation

## 2013-10-30 DIAGNOSIS — R0602 Shortness of breath: Secondary | ICD-10-CM | POA: Insufficient documentation

## 2013-10-30 DIAGNOSIS — N183 Chronic kidney disease, stage 3 unspecified: Secondary | ICD-10-CM

## 2013-10-30 DIAGNOSIS — N189 Chronic kidney disease, unspecified: Secondary | ICD-10-CM

## 2013-10-30 DIAGNOSIS — I119 Hypertensive heart disease without heart failure: Secondary | ICD-10-CM | POA: Insufficient documentation

## 2013-10-30 DIAGNOSIS — E1165 Type 2 diabetes mellitus with hyperglycemia: Secondary | ICD-10-CM

## 2013-10-30 DIAGNOSIS — E118 Type 2 diabetes mellitus with unspecified complications: Secondary | ICD-10-CM

## 2013-10-30 DIAGNOSIS — I161 Hypertensive emergency: Secondary | ICD-10-CM

## 2013-10-30 MED ORDER — ISOSORBIDE MONONITRATE ER 30 MG PO TB24: 30.0000 mg | ORAL_TABLET | Freq: Two times a day (BID) | ORAL | Status: DC

## 2013-10-30 MED ORDER — VERAPAMIL HCL 120 MG PO TABS: 120.0000 mg | ORAL_TABLET | Freq: Two times a day (BID) | ORAL | Status: DC

## 2013-10-30 MED ORDER — CARVEDILOL 12.5 MG PO TABS: 12.5000 mg | ORAL_TABLET | Freq: Two times a day (BID) | ORAL | Status: DC

## 2013-10-30 NOTE — Progress Notes (Signed)
Patient ID: Sean Buck, male    DOB: 1970/07/02, 43 y.o.   MRN: [id number]  HPI Comments: Sean Buck is a 43 yo M with a history of hypertension, renal insufficiency, diabetes, medication noncompliance secondary to financial issues presenting to the hospital 09/14/2013 with glucose levels in the 500 range, significant hypertension, creatinine 3.3 (baseline 3.0 in 2014), moderate LVH, obstructive sleep apnea on CPAP who presents to establish care clinic. He was seen by Dr. Lady GaryFath  in the hospital    followup today, he reports that his blood pressure continues to run high. He is not checking it on a regular basis at home. Recently started on Norvasc one week ago, 5 mg. He has not noticed a dramatic improvement in his blood pressure. He reports having significant shortness of breath with exertion. He's had a difficult time at work given his shortness of breath and he tries to exert himself. He states that he needs to fix his symptoms quickly to continue working.  Notes from the hospital indicate initial blood pressure 200/80 5/177. Other measurements in the hospital on his medications 152/91. He was restarted on insulin. He currently does not have his hydralazine  Blood pressure in the office today is in the 180 range systolic Hemoglobin A1c greater than 13  Recent EKG 09/23/2011 showing normal sinus rhythm with LVH, left anterior fascicular block, poor R-wave progression   Outpatient Encounter Prescriptions as of 10/30/2013  Medication Sig  . aspirin 81 MG tablet Take 81 mg by mouth daily.  . cloNIDine (CATAPRES) 0.3 MG tablet Take 1 tablet (0.3 mg total) by mouth 2 (two) times daily.  . diazepam (VALIUM) 5 MG tablet Take 5 mg by mouth every 6 (six) hours as needed for muscle spasms.   . furosemide (LASIX) 40 MG tablet Take 40 mg by mouth daily.   Marland Kitchen. LEVEMIR FLEXTOUCH 100 UNIT/ML Pen Inject 40 Units into the skin at bedtime.   . Magnesium 500 MG CAPS Take 500 mg by mouth daily.  Marland Kitchen. NOVOLOG  FLEXPEN 100 UNIT/ML FlexPen Inject 26 Units into the skin 3 (three) times daily with meals.   Marland Kitchen. oxyCODONE-acetaminophen (PERCOCET) 7.5-325 MG per tablet as needed.  . predniSONE (STERAPRED UNI-PAK) 10 MG tablet as needed.  Marland Kitchen. amLODipine (NORVASC) 5 MG tablet daily.  .  isosorbide mononitrate (IMDUR) 30 MG 24 hr tablet Take 30 mg by mouth daily.   . metoprolol tartrate (LOPRESSOR) 25 MG tablet Take 25 mg by mouth 2 (two) times daily.      Review of Systems  Constitutional: Negative.   HENT: Negative.   Eyes: Negative.   Respiratory: Positive for shortness of breath.   Cardiovascular: Negative.   Gastrointestinal: Negative.   Endocrine: Negative.   Musculoskeletal: Negative.   Skin: Negative.   Allergic/Immunologic: Negative.   Neurological: Negative.   Hematological: Negative.   Psychiatric/Behavioral: Negative.   All other systems reviewed and are negative.   BP 184/116  Pulse 73  Ht 5\' 9"  (1.753 m)  Wt 315 lb 8 oz (143.11 kg)  BMI 46.57 kg/m2  Physical Exam  Nursing note and vitals reviewed. Constitutional: He is oriented to person, place, and time. He appears well-developed and well-nourished.  Morbidly obese  HENT:  Head: Normocephalic.  Nose: Nose normal.  Mouth/Throat: Oropharynx is clear and moist.  Eyes: Conjunctivae are normal. Pupils are equal, round, and reactive to light.  Neck: Normal range of motion. Neck supple. No JVD present.  Cardiovascular: Normal rate, regular rhythm, S1 normal, S2 normal,  normal heart sounds and intact distal pulses.  Exam reveals no gallop and no friction rub.   No murmur heard. Pulmonary/Chest: Effort normal and breath sounds normal. No respiratory distress. He has no wheezes. He has no rales. He exhibits no tenderness.  Abdominal: Soft. Bowel sounds are normal. He exhibits no distension. There is no tenderness.  Musculoskeletal: Normal range of motion. He exhibits no edema and no tenderness.  Lymphadenopathy:    He has no cervical  adenopathy.  Neurological: He is alert and oriented to person, place, and time. Coordination normal.  Skin: Skin is warm and dry. No rash noted. No erythema.  Psychiatric: He has a normal mood and affect. His behavior is normal. Judgment and thought content normal.      Assessment and Plan

## 2013-10-30 NOTE — Assessment & Plan Note (Signed)
Creatinine in the high 2, low 3 range. He is currently on Lasix daily for shortness of breath symptoms. Followed by nephrology

## 2013-10-30 NOTE — Patient Instructions (Addendum)
Please take two amlodipine starting  Today Once you run out, Start verapamil 120 mg twice a day  Next: Stop the metoprolol Start coreg/cardvedilol 12.5 mg twice a day  Next: Start isosorbide 30 mg twice a day  Stay on the clonidine  Please call us if you have new issues that need to be addressed before your next appt.  Your physician wants you to follow-up in: 1 month.

## 2013-10-30 NOTE — Assessment & Plan Note (Signed)
Moderate LVH on echocardiogram in the hospital. This is likely secondary to long-standing severe hypertension. This is also contributing to shortness of breath

## 2013-10-30 NOTE — Assessment & Plan Note (Signed)
Blood pressure continues to run high. For now we have suggested he increase his amlodipine to 10 mg daily. when this runs out, we have recommended verapamil 120 mg twice a day. When metoprolol runs out, would change this to carvedilol 12.5 mg twice a day . Continue on clonidine . Recommended if blood pressure continues to run high on this regimen, would increase isosorbide to 30 mg twice a day . Hydralazine to be used if needed

## 2013-10-30 NOTE — Assessment & Plan Note (Signed)
Previously seen by Dr. Ronn MelenaKolloru. High risk of worsening renal failure and premature end-stage renal disease, high risk of dialysis

## 2013-10-30 NOTE — Assessment & Plan Note (Signed)
His biggest issue is his shortness of breath. I suspect this is multifactorial from obesity, deconditioning, LVH and diastolic CHF. Blood pressure is poorly controlled which is likely a large contributor to his symptoms. We have strongly recommended he make several medication changes. He is unable to afford these changes at this time but we'll try to do so slowly.

## 2013-10-30 NOTE — Assessment & Plan Note (Signed)
Diabetes is poorly controlled. He currently takes insulin. Dietary guide given to him today

## 2013-11-18 ENCOUNTER — Other Ambulatory Visit: Payer: Self-pay

## 2013-11-18 MED ORDER — FUROSEMIDE 40 MG PO TABS: 40.0000 mg | ORAL_TABLET | Freq: Every day | ORAL | Status: AC

## 2013-11-18 MED ORDER — CLONIDINE HCL 0.3 MG PO TABS: 0.3000 mg | ORAL_TABLET | Freq: Two times a day (BID) | ORAL | Status: DC

## 2013-12-02 NOTE — Telephone Encounter (Signed)
This encounter was created in error - please disregard.

## 2013-12-03 ENCOUNTER — Encounter: Payer: Self-pay | Admitting: Cardiovascular Disease

## 2013-12-03 ENCOUNTER — Ambulatory Visit (INDEPENDENT_AMBULATORY_CARE_PROVIDER_SITE_OTHER): Payer: BC Managed Care – PPO | Admitting: Cardiovascular Disease

## 2013-12-03 VITALS — BP 130/100 | HR 59 | Ht 69.0 in | Wt 326.8 lb

## 2013-12-03 DIAGNOSIS — N183 Chronic kidney disease, stage 3 unspecified: Secondary | ICD-10-CM

## 2013-12-03 DIAGNOSIS — I119 Hypertensive heart disease without heart failure: Secondary | ICD-10-CM

## 2013-12-03 DIAGNOSIS — E1165 Type 2 diabetes mellitus with hyperglycemia: Secondary | ICD-10-CM

## 2013-12-03 DIAGNOSIS — R079 Chest pain, unspecified: Secondary | ICD-10-CM

## 2013-12-03 DIAGNOSIS — E118 Type 2 diabetes mellitus with unspecified complications: Secondary | ICD-10-CM

## 2013-12-03 DIAGNOSIS — R0602 Shortness of breath: Secondary | ICD-10-CM

## 2013-12-03 DIAGNOSIS — I10 Essential (primary) hypertension: Secondary | ICD-10-CM

## 2013-12-03 DIAGNOSIS — IMO0002 Reserved for concepts with insufficient information to code with codable children: Secondary | ICD-10-CM

## 2013-12-03 MED ORDER — HYDRALAZINE HCL 50 MG PO TABS: 50.0000 mg | ORAL_TABLET | Freq: Three times a day (TID) | ORAL | Status: DC

## 2013-12-03 MED ORDER — ISOSORBIDE MONONITRATE ER 60 MG PO TB24: 60.0000 mg | ORAL_TABLET | Freq: Two times a day (BID) | ORAL | Status: AC

## 2013-12-03 MED ORDER — ALBUTEROL SULFATE HFA 108 (90 BASE) MCG/ACT IN AERS: 2.0000 | INHALATION_SPRAY | RESPIRATORY_TRACT | Status: DC | PRN

## 2013-12-03 NOTE — Progress Notes (Signed)
Patient ID: Sean Buck, male    DOB: September 24, 1970, 43 y.o.   MRN: [id number]  HPI Comments: Sean Buck is a 43 yo M with a history of hypertension, renal insufficiency, diabetes, medication noncompliance secondary to financial issues [facility] with glucose levels in the 500 range, significant hypertension, creatinine 3.3 (baseline 3.0 in 2014), echocardiogram showing normal ejection fraction, moderate LVH, obstructive sleep apnea on CPAP who presents for routine followup in the clinic  On his last clinic visit, we made several medication changes for severe hypertension, elevated heart rate. In followup today, he is taking most of his medications, though reports blood pressure continues to be poorly controlled. He reports systolic pressure in the 170s. He went to the beach last week and was very short of breath which has been a chronic issue. He reports not being able to walk to the beach and enjoy himself and sat in his room. He ate heavily throughout the course of the week and gained 11 pounds. He does not feel that he can work at his current job much longer given his severe shortness of breath, job.  In the past he reports that her blood pressure control typically lead to improve shortness of breath. This is not the case in the past several months despite changes to his medications.  He states that he needs to fix his symptoms quickly to continue working. He is currently not checking his sugars, not taking insulin.  Glucose check in the office today 190 He does not have his Lasix, does not have hydralazine. He is unable to afford them at this time.  Prior Hemoglobin A1c greater than 13  Recent EKG 09/23/2011 showing normal sinus rhythm with LVH, left anterior fascicular block, poor R-wave progression Repeat EKG today normal sinus rhythm with rate 59 beats per minute, nonspecific ST and T wave abnormality in V4 through V6   Outpatient Encounter  Prescriptions as of 12/03/2013  Medication Sig  . aspirin 81 MG tablet Take 81 mg by mouth daily.  . carvedilol (COREG) 12.5 MG tablet Take 1 tablet (12.5 mg total) by mouth 2 (two) times daily.  . cloNIDine (CATAPRES) 0.3 MG tablet Take 1 tablet (0.3 mg total) by mouth 2 (two) times daily.  . diazepam (VALIUM) 5 MG tablet Take 5 mg by mouth every 6 (six) hours as needed for muscle spasms.   . furosemide (LASIX) 40 MG tablet Take 1 tablet (40 mg total) by mouth daily.  Marland Kitchen. LEVEMIR FLEXTOUCH 100 UNIT/ML Pen Inject 40 Units into the skin at bedtime.   . Magnesium 500 MG CAPS Take 500 mg by mouth daily.  Marland Kitchen. NOVOLOG FLEXPEN 100 UNIT/ML FlexPen Inject 26 Units into the skin 3 (three) times daily with meals.   Marland Kitchen. oxyCODONE-acetaminophen (PERCOCET) 7.5-325 MG per tablet as needed.  . predniSONE (STERAPRED UNI-PAK) 10 MG tablet as needed.  Marland Kitchen. UNABLE TO FIND Mr. Everardo Allllison was admitted to the hospital from 09/22/13-09/23/13.  Marland Kitchen. verapamil (CALAN) 120 MG tablet Take 1 tablet (120 mg total) by mouth 2 (two) times daily.  .  isosorbide mononitrate (IMDUR) 30 MG 24 hr tablet Take 1 tablet (30 mg total) by mouth 2 (two) times daily.    Review of Systems  Constitutional: Positive for unexpected weight change.  HENT: Negative.   Eyes: Negative.   Respiratory: Positive for shortness of breath.   Cardiovascular: Negative.   Gastrointestinal: Negative.   Endocrine: Negative.   Musculoskeletal: Negative.   Skin: Negative.  Allergic/Immunologic: Negative.   Neurological: Negative.   Hematological: Negative.   Psychiatric/Behavioral: Negative.   All other systems reviewed and are negative.   BP 130/100  Pulse 59  Ht 5\' 9"  (1.753 m)  Wt 326 lb 12 oz (148.213 kg)  BMI 48.23 kg/m2  Physical Exam  Nursing note and vitals reviewed. Constitutional: He is oriented to person, place, and time. He appears well-developed and well-nourished.  Morbidly obese  HENT:  Head: Normocephalic.  Nose: Nose normal.   Mouth/Throat: Oropharynx is clear and moist.  Eyes: Conjunctivae are normal. Pupils are equal, round, and reactive to light.  Neck: Normal range of motion. Neck supple. No JVD present.  Cardiovascular: Normal rate, regular rhythm, S1 normal, S2 normal, normal heart sounds and intact distal pulses.  Exam reveals no gallop and no friction rub.   No murmur heard. Pulmonary/Chest: Effort normal and breath sounds normal. No respiratory distress. He has no wheezes. He has no rales. He exhibits no tenderness.  Abdominal: Soft. Bowel sounds are normal. He exhibits no distension. There is no tenderness.  Musculoskeletal: Normal range of motion. He exhibits no edema and no tenderness.  Lymphadenopathy:    He has no cervical adenopathy.  Neurological: He is alert and oriented to person, place, and time. Coordination normal.  Skin: Skin is warm and dry. No rash noted. No erythema.  Psychiatric: He has a normal mood and affect. His behavior is normal. Judgment and thought content normal.      Assessment and Plan

## 2013-12-03 NOTE — Assessment & Plan Note (Signed)
Long-standing renal disease from chronic severe hypertension, poorly controlled diabetes

## 2013-12-03 NOTE — Assessment & Plan Note (Signed)
Moderate LVH on prior echocardiogram. We have stressed to him the importance of strict blood pressure control

## 2013-12-03 NOTE — Patient Instructions (Addendum)
Please increase the isosorbide up to 60 mg twice a day Start hydralazine 50 mg three times a day Please start lasix when you pick up your prescription  Please try albuterol as needed for shortness of breath  We will set up a stress test for shortness of breath: Monday (6/22) AND Tuesday (6/23) at 7:15am Hold the isosorbide and verapamil the night before the test Hold the blood pressure meds the morning of the test No food the morning of the test No smoking before test  We will set up an appt with the pulmonary doctor: Dr. Sherene SiresWert, tomorrow (6/17) at 3:00 [address], [place] [phone]  Please call us if you have new issues that need to be addressed before your next appt.   [facility] MYOVIEW  Your caregiver has ordered a Stress Test with nuclear imaging. The purpose of this test is to evaluate the blood supply to your heart muscle. This procedure is referred to as a "Non-Invasive Stress Test." This is because other than having an IV started in your vein, nothing is inserted or "invades" your body. Cardiac stress tests are done to find areas of poor blood flow to the heart by determining the extent of coronary artery disease (CAD). Some patients exercise on a treadmill, which naturally increases the blood flow to your heart, while others who are  unable to walk on a treadmill due to physical limitations have a pharmacologic/chemical stress agent called Lexiscan . This medicine will mimic walking on a treadmill by temporarily increasing your coronary blood flow.   Please note: these test may take anywhere between 2-4 hours to complete  PLEASE REPORT TO [facility]  THE VOLUNTEERS AT THE FIRST DESK WILL DIRECT YOU WHERE TO GO  Date of Procedure:____Monday, June 22 AND Tuesday, June 23  (THIS IS A 2 PART TEST)__________  Arrival Time for Procedure:_____7:15am________________  Instructions regarding medication:   _X_:  Hold other medications as follows:____isorsorbide,  verapamil, carvedilol (BP meds)_______  PLEASE NOTIFY THE OFFICE AT LEAST 24 HOURS IN ADVANCE IF YOU ARE UNABLE TO KEEP YOUR APPOINTMENT.  [phone] AND  PLEASE NOTIFY NUCLEAR MEDICINE AT [facility] AT LEAST 24 HOURS IN ADVANCE IF YOU ARE UNABLE TO KEEP YOUR APPOINTMENT. [phone]  How to prepare for your Myoview test:  1. Do not eat or drink after midnight 2. No caffeine for 24 hours prior to test 3. No smoking 24 hours prior to test. 4. Your medication may be taken with water.  If your doctor stopped a medication because of this test, do not take that medication. 5. Ladies, please do not wear dresses.  Skirts or pants are appropriate. Please wear a short sleeve shirt. 6. No perfume, cologne or lotion. 7. Wear comfortable walking shoes. No heels!

## 2013-12-03 NOTE — Assessment & Plan Note (Signed)
For his blood pressure, we have recommended that he start hydralazine 50 mg 3 times a day, also increase his isosorbide mononitrate to 60 mg twice a day. Uncertain if medication compliance is an issue but he reports that he has most of his medications

## 2013-12-03 NOTE — Assessment & Plan Note (Signed)
Etiology of his shortness of breath is likely multifactorial. States that he is desperate and will be unable to work if symptoms persist.  He is very deconditioned, obese, likely has diastolic CHF. Unable to afford Lasix at this time but states that he will pick it up today. Blood pressure continues to run high likely contributing to his moderate LVH and diastolic dysfunction, as well as shortness of breath. No changes to his blood pressure medications as above.   We have offered right heart catheterization. He prefers a noninvasive approach. Prior echocardiogram did not document right heart pressures. Given obstructive sleep apnea, likely obesity hypoventilation, he would be high risk of pulmonary hypertension. We have recommended he start his diuretic regimen.  As he is desperate to improve his symptoms, he is requesting further evaluation. We will for him to pulmonary. Unable to definitively exclude a pulmonary process. Low risk of DVT though not excluded. Could consider a VQ scan given prior renal dysfunction. No clinical signs concerning for pulmonary fibrosis though could consider CT with non-contrast. Again right heart catheterization was offered.   He is also concerned about underlying coronary artery disease. At his request a two day Myoview will be ordered for early next week.

## 2013-12-03 NOTE — Assessment & Plan Note (Signed)
Recommended he established care with primary physician for management of his diabetes. He is not checking his sugars, not taking insulin

## 2013-12-04 ENCOUNTER — Encounter: Payer: Self-pay | Admitting: Internal Medicine

## 2013-12-04 ENCOUNTER — Ambulatory Visit (INDEPENDENT_AMBULATORY_CARE_PROVIDER_SITE_OTHER): Payer: BC Managed Care – PPO | Admitting: Internal Medicine

## 2013-12-04 ENCOUNTER — Ambulatory Visit (INDEPENDENT_AMBULATORY_CARE_PROVIDER_SITE_OTHER)
Admission: RE | Admit: 2013-12-04 | Discharge: 2013-12-04 | Disposition: A | Discharge status: Home / Self Care | Payer: BC Managed Care – PPO | Source: Ambulatory Visit | Attending: Internal Medicine | Admitting: Internal Medicine

## 2013-12-04 VITALS — BP 132/80 | HR 50 | Temp 98.1°F | Ht 69.0 in | Wt 328.4 lb

## 2013-12-04 DIAGNOSIS — R0602 Shortness of breath: Secondary | ICD-10-CM

## 2013-12-04 DIAGNOSIS — G4733 Obstructive sleep apnea (adult) (pediatric): Secondary | ICD-10-CM

## 2013-12-04 DIAGNOSIS — Z9989 Dependence on other enabling machines and devices: Secondary | ICD-10-CM

## 2013-12-04 NOTE — Progress Notes (Signed)
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 Pt will need to follow up with PCP in 2 weeks post discharge 2. Please obtain BMP to evaluate electrolytes and kidney function 3. Please also check CBC to evaluate Hg and Hct levels 4. Follow up with nephrology--Dr. Kollaru--09/30/13 Discharge Diagnoses:  Active Problems:  Hypertensive emergency  CKD (chronic kidney disease) stage 3, GFR 30-59 ml/min  Hypertensive nephropathy  Acute respiratory failure  hypertensive emergency  -Patient presented with acute respiratory failure and flash pulmonary edema  -This is likely due to noncompliance with his antihypertensive medications contrary to pt's claims  -I have called to patient's pharmacy and verified large gaps in the patient's refills of his antihypertensive regimen  -As a result, I would not undertake an extensive workup at this time until there is documented compliance  -The patient's hypertensive emergency likely reflects rebound hypertension from the patient's noncompliance with clonidine  -Remarkably, the patient's blood pressure has been controlled during his stay in the hospital  -As a result, I will not change any of his anti-HTN at this time--he was instructed to continue clonidine, metoprolol, imdur, and lasix and hydralazine  -Patient remains clinically stable on room air without any respiratory distress  - patient will be discharged in stable condition to follow up with his nephrologist [place] [facility], [name], with which the patient has an  appointment on 09/30/2013  -Urine drug screen was negative  Acute respiratory failure  -Secondary to flash pulmonary edema from hypertensive emergency  -Resolved  -Clinically stable on room air  -Echocardiogram showed EF 50-55%, positive diastolic dysfunction  -He will continue furosemide 40 mg daily after discharge  -appt will be made with cardiology, [name] for appt in [place] for follow up on 09/30/13  CKD stage III  -Secondary to hypertensive nephropathy due to noncompliance  -Serum creatinine appears to be at baseline  -Serum creatinine 2.58 on the day of discharge  -Patient will followup in nephrology  -Please followup on serum aldosterone/renin ratio  -TSH 1.320  -Patient had serum creatinine of 3.32 [facility]  Diabetes mellitus type 2  -CBG is fairly controlled during the hospitalization  -Hemoglobin A1c 13.2  -Patient will resume his home diabetic regimen  -suspect noncompliance  Hyperlipidemia  -Continue statin     12/04/2013 1st [place] [facility] office visit/ Wert last worked today but can't do much at all in terms of physical activity  Chief Complaint  Patient presents with  . Pulmonary Consult    Referred per [name]. Pt c/o chest pressure and DOE with any exertion for the past 2 months. He states that he sometimes uses CPAP machine during the day to help relieve SOB.    sleeps on side on cpap machine x 2 y s titration otherwise needs 45 degrees elevation to be comfortable with breathing Has attacks sitting still and they last until cpap x 20 min Doe x slow x one aisle at [employer]   No obvious other patterns in day to day or daytime variabilty or  assoc chronic cough or cp or chest tightness, subjective wheeze overt sinus or hb symptoms. No unusual exp hx or h/o childhood pna/ asthma or knowledge of premature birth.  Sleeping ok without nocturnal  or early am exacerbation  of respiratory  c/o's or need for noct saba. Also denies any  obvious fluctuation of symptoms with weather or environmental changes or other aggravating or alleviating factors except as outlined above   Current Medications, Allergies, Complete Past Medical History, Past Surgical History, Family History, and Social History were reviewed in [facility] electronic medical record.             Review of Systems     Objective:   Physical Exam Wt Readings from Last 3 Encounters:  12/04/13 328 lb 6.4 oz (148.961 kg)  12/03/13 326 lb 12 oz (148.213 kg)  10/30/13 315 lb 8 oz (143.11 kg)     amb bm somewhat evasive with answers   HEENT: nl dentition, turbinates, and orophanx. Nl external ear canals without cough reflex   NECK :  without JVD/Nodes/TM/ nl carotid upstrokes bilaterally   LUNGS: no acc muscle use, clear to A and P bilaterally without cough on insp or exp maneuvers   CV:  RRR  no s3 or murmur or increase in P2, no edema   ABD:  soft and nontender with nl excursion in the supine position. No bruits or organomegaly, bowel sounds nl  MS:  warm without deformities, calf tenderness, cyanosis or clubbing  SKIN: warm and dry without lesions    NEURO:  alert, approp, no deficits    cxr 12/04/13 Moderate cardiomegaly with diffuse pulmonary vascular congestion  without overt pulmonary edema      Assessment:

## 2013-12-04 NOTE — Patient Instructions (Addendum)
Please remember to go to the xray  department downstairs for your tests - we will call you with the results when they are available.  You do not have a significant lung problem  But your cpap may be not adjusted to work at its best and I will recommend you see a sleep specialist to get the best set up but this should probably be in [place] where all your other care is co-ordinated.  In meantime need to make every effort to avoid salt in your diet and lose as much weight as you can  followup here is as needed

## 2013-12-05 ENCOUNTER — Encounter: Payer: Self-pay | Admitting: Internal Medicine

## 2013-12-05 ENCOUNTER — Ambulatory Visit: Payer: BC Managed Care – PPO | Admitting: Cardiovascular Disease

## 2013-12-05 DIAGNOSIS — G4733 Obstructive sleep apnea (adult) (pediatric): Secondary | ICD-10-CM | POA: Insufficient documentation

## 2013-12-05 DIAGNOSIS — Z9989 Dependence on other enabling machines and devices: Secondary | ICD-10-CM

## 2013-12-05 NOTE — Assessment & Plan Note (Signed)
12/04/2013  Walked RA x 3 laps @ 185 ft each stopped due to min sob, no desat 12/04/13 spirometry mod restriction, no obstruction   Sob appears entirely consistent with obesity plus diastolic chf aggravated by vol overload from CRI and poorly controlled hbp which may in turn be partially related to inadequate cpap (see osa).  My advice for his bp is to use minoxidil instead of hydralazine as only has to be used once daily and should improve documented poor compliance  No pulmonary f/u in [place] needed unless one of our sleep docs is requested

## 2013-12-05 NOTE — Progress Notes (Signed)
Quick Note:  LMTCB ______ 

## 2013-12-05 NOTE — Assessment & Plan Note (Signed)
Needs cpap titration and f/u by sleep doc but prefer this be in [place] if possible, if not, he can certainly be referred to one of our sleep docs

## 2013-12-06 ENCOUNTER — Telehealth: Payer: Self-pay | Admitting: Internal Medicine

## 2013-12-06 NOTE — Telephone Encounter (Signed)
Result Note     Call pt: Reviewed cxr and no acute change so no change in recommendations made at ov  ---  I spoke with patient about results and he verbalized understanding and had no questions 

## 2013-12-09 ENCOUNTER — Ambulatory Visit: Payer: Self-pay | Admitting: Cardiovascular Disease

## 2013-12-09 DIAGNOSIS — R079 Chest pain, unspecified: Secondary | ICD-10-CM

## 2013-12-10 ENCOUNTER — Telehealth: Payer: Self-pay | Admitting: *Deleted

## 2013-12-10 ENCOUNTER — Telehealth: Payer: Self-pay | Admitting: Internal Medicine

## 2013-12-10 DIAGNOSIS — G4733 Obstructive sleep apnea (adult) (pediatric): Secondary | ICD-10-CM

## 2013-12-10 DIAGNOSIS — Z9989 Dependence on other enabling machines and devices: Principal | ICD-10-CM

## 2013-12-10 NOTE — Telephone Encounter (Signed)
Spoke with the pt He states that he is is need of a new CPAP machine  He has not had a sleep study "in a few years"  Was told by his DME in [place] that he would need new PSG and order for CPAP and supplies  He is not est with a sleep specialist  Cards advised him to call Wert Please advise, thanks!

## 2013-12-10 NOTE — Telephone Encounter (Signed)
Go ahead then and order PSG and sleep eval

## 2013-12-10 NOTE — Telephone Encounter (Signed)
Patient called and Dr. Sherene SiresWert advised him to have an updated sleep study due to needing a new cpap machine. Please call patient.

## 2013-12-10 NOTE — Telephone Encounter (Signed)
lmomtcb x1 for pt 

## 2013-12-10 NOTE — Telephone Encounter (Signed)
Spoke w/ pt.  He reports that he "stopped by to pick up a new cpap", but was told that he would need a doctor's order for new sleep study in order to get this.  Advised pt to contact Dr. Thurston HoleWert's office about setting up a sleep study.  He verbalizes understanding and will call us if we can be of further assistance.

## 2013-12-11 ENCOUNTER — Other Ambulatory Visit: Payer: Self-pay

## 2013-12-11 DIAGNOSIS — R0602 Shortness of breath: Secondary | ICD-10-CM

## 2013-12-11 DIAGNOSIS — R079 Chest pain, unspecified: Secondary | ICD-10-CM

## 2013-12-11 NOTE — Telephone Encounter (Signed)
Order placed and appt set for 01-10-14. Pt mother is aware. Will have the pt call back if need to change the appt. Carron CurieJennifer Talal Fritchman, CMA

## 2013-12-16 ENCOUNTER — Telehealth: Payer: Self-pay

## 2013-12-16 NOTE — Telephone Encounter (Signed)
Pt would like stress test results. Please call. 

## 2013-12-16 NOTE — Telephone Encounter (Signed)
Spoke w/ pt.  Reviewed results of stress test w/ him. He reports that he is still having SOB and would like to know what step he needs to take next.  Pt saw Dr. Sherene SiresWert who advised him to repeat sleep study in order to update CPAP settings. He states that he does not feel that his doctors are doing enough to get to the source of his problem. He would like another PCP, as he would like someone to coordinate his care. Gave pt names of local primary care offices. He would like to know if Dr. Mariah MillingGollan would like to proceed w/ any further testing or has any other suggestions.

## 2013-12-19 NOTE — Telephone Encounter (Signed)
Left message w/ pt's wife to have pt call back.  Attempted to contact pt at work, but he had already left for the day.

## 2013-12-19 NOTE — Telephone Encounter (Signed)
Caused his breathing is still uncertain Would recommend he resume his Lasix as fluid overload could be contributing to his breathing Also need to monitor his blood pressure closely as high blood pressure could contribute to symptoms  Pulmonary doctor wanted new numbers for his sleep apnea  Other testing might include the catheter in the hospital to determine pressures in the heart This is an invasive procedure  Could consider CT scan of the chest though would not be able to use dye and benefit would be limited  Could also consider repeat echocardiogram an effort to estimate pressures in the heart

## 2013-12-24 ENCOUNTER — Ambulatory Visit (HOSPITAL_BASED_OUTPATIENT_CLINIC_OR_DEPARTMENT_OTHER): Payer: BC Managed Care – PPO | Attending: Internal Medicine

## 2013-12-24 VITALS — Ht 69.0 in | Wt 325.0 lb

## 2013-12-24 DIAGNOSIS — Z9989 Dependence on other enabling machines and devices: Secondary | ICD-10-CM

## 2013-12-24 DIAGNOSIS — G4733 Obstructive sleep apnea (adult) (pediatric): Secondary | ICD-10-CM

## 2013-12-24 NOTE — Telephone Encounter (Signed)
This encounter was created in error - please disregard.

## 2013-12-24 NOTE — Telephone Encounter (Signed)
Spoke w/ Sean Buck.  Advised him of Sean Buck's recommendations.  Sean Buck is uncertain about which of these he would like to proceed w/ and would like to discuss w/ Sean Buck.  Sean Buck sched to see Sean Buck 01/01/14 @ 8:45.

## 2013-12-26 ENCOUNTER — Other Ambulatory Visit: Payer: Self-pay

## 2013-12-26 DIAGNOSIS — G4733 Obstructive sleep apnea (adult) (pediatric): Secondary | ICD-10-CM

## 2013-12-26 DIAGNOSIS — R0602 Shortness of breath: Secondary | ICD-10-CM

## 2013-12-31 ENCOUNTER — Encounter: Payer: Self-pay | Admitting: Internal Medicine

## 2013-12-31 DIAGNOSIS — G4730 Sleep apnea, unspecified: Secondary | ICD-10-CM

## 2013-12-31 DIAGNOSIS — G4710 Hypersomnia, unspecified: Secondary | ICD-10-CM

## 2013-12-31 NOTE — Sleep Study (Signed)
   NAME: Sean Buck DATE OF BIRTH:  1971-01-25 MEDICAL RECORD NUMBER [id number]  LOCATION: [facility]  PHYSICIAN: Barbaraann ShareCLANCE,KEITH M  DATE OF STUDY: 12/24/2013  SLEEP STUDY TYPE: Nocturnal Polysomnogram               REFERRING PHYSICIAN: Nyoka CowdenWert, Michael B, MD  INDICATION FOR STUDY: Hypersomnia with sleep apnea  EPWORTH SLEEPINESS SCORE:   6 HEIGHT: 5\' 9"  (175.3 cm)  WEIGHT: 325 lb (147.419 kg)    Body mass index is 47.97 kg/(m^2).  NECK SIZE: 18 in.  MEDICATIONS: Reviewed in the sleep record  SLEEP ARCHITECTURE: The patient had a total sleep time of 310 minutes with no slow-wave sleep and only 19 minutes of REM. Sleep onset latency was normal at 3.5 minutes, and REM onset was normal at 111 minutes. Sleep efficiency was mildly reduced at 85%.  RESPIRATORY DATA: The patient was found to have 21 apneas and 76 obstructive hypopneas, giving him an AHI of 19 events per hour. The events occurred in all body positions, and there was moderate snoring noted throughout.  OXYGEN DATA: There was oxygen desaturation as low as 78% with the patient's obstructive events  CARDIAC DATA: Rare PVCs were noted  MOVEMENT/PARASOMNIA: The patient had no significant periodic limb movements or other behavioral abnormalities.  IMPRESSION/ RECOMMENDATION:    1) moderate obstructive sleep apnea/hypopnea syndrome, with an AHI of 19 events per hour and oxygen desaturation as low as 78%. Treatment for this degree of sleep apnea can include a trial of weight loss alone, upper airway surgery, dental appliance, and also CPAP. Clinical correlation is suggested.  2) rare PVC noted, but no clinically significant arrhythmias were seen     Barbaraann ShareLANCE,KEITH M Diplomate, American Board of Sleep Medicine  ELECTRONICALLY SIGNED ON:  12/31/2013, 8:46 AM [facility] PH: [phone]   FX: [phone] ACCREDITED BY THE AMERICAN ACADEMY OF SLEEP MEDICINE

## 2014-01-01 ENCOUNTER — Ambulatory Visit: Payer: BC Managed Care – PPO | Admitting: Cardiovascular Disease

## 2014-01-09 ENCOUNTER — Other Ambulatory Visit (INDEPENDENT_AMBULATORY_CARE_PROVIDER_SITE_OTHER): Payer: BC Managed Care – PPO

## 2014-01-09 ENCOUNTER — Other Ambulatory Visit: Payer: Self-pay

## 2014-01-09 DIAGNOSIS — G4733 Obstructive sleep apnea (adult) (pediatric): Secondary | ICD-10-CM

## 2014-01-09 DIAGNOSIS — R0602 Shortness of breath: Secondary | ICD-10-CM

## 2014-01-10 ENCOUNTER — Institutional Professional Consult (permissible substitution): Payer: BC Managed Care – PPO | Admitting: Pulmonary Disease

## 2014-01-15 ENCOUNTER — Telehealth: Payer: Self-pay | Admitting: Pulmonary Disease

## 2014-01-15 NOTE — Telephone Encounter (Signed)
Pt had sleep study 12/24/13. Has sleep consult scheduled with RA 02/13/14 Called spoke with pt. Pt r/s his appt to see RA Friday afternoon at 1:30. Nothing further needed

## 2014-01-17 ENCOUNTER — Encounter: Payer: Self-pay | Admitting: Pulmonary Disease

## 2014-01-17 ENCOUNTER — Encounter: Payer: Self-pay | Admitting: *Deleted

## 2014-01-17 ENCOUNTER — Ambulatory Visit (INDEPENDENT_AMBULATORY_CARE_PROVIDER_SITE_OTHER): Payer: BC Managed Care – PPO | Admitting: Pulmonary Disease

## 2014-01-17 VITALS — BP 142/80 | HR 65 | Temp 99.4°F | Ht 69.0 in | Wt 317.0 lb

## 2014-01-17 DIAGNOSIS — Z9989 Dependence on other enabling machines and devices: Principal | ICD-10-CM

## 2014-01-17 DIAGNOSIS — G4733 Obstructive sleep apnea (adult) (pediatric): Secondary | ICD-10-CM

## 2014-01-17 NOTE — Progress Notes (Signed)
Subjective:    Patient ID: Sean Buck, male    DOB: [birth year], 43 y.o.   MRN: [id number]  HPI  43 year old obese man referred for evaluation of obstructive sleep apnea.  He has hypertension,CKD and diabetes-now diet controlled and LVH on echo. He was admitted 08/2013 with hyperglycemia, hypertensive crisis. He requires for medications for blood pressure control, and has come off insulin for the past 2 months.   Reports OSA diagnosis in 2011 and was placed on CPAP 8-18 cm, nasal, but was poorly compliant with this prior to his hospital admission. Since then, he is using this on a nightly basis. He underwent UPPP in 2010. He reports extreme dryness of mouth it persists during the daytime and wonders if his medication is causing it. He also reports increased sluggishness due to his antihypertensives. Epworth sleepiness score is 15 Bedtime is 10:30 to 11 PM, sleep latency is 30-60 minutes, he sleeps on his side with 2 pillows, reports 3-4 nocturnal awakenings and is out of bed at 4:30 AM with occasional headache and dryness of mouth. His rate was as high as 325 pounds and is now 317 pounds. 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 He quit smoking in 2008, about 20-pack-years. Spirometry did not show any obstruction, only showed moderate restriction due to obesity.  Overnight polysomnogram in 12/3013 showed moderate OSA with AHI of 19 per hour with severe desaturation to 78%.   Past Medical History  Diagnosis Date  . 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 CHF (congestive heart failure)     Chart Hx per [facility], EF unknown  . Gout   . Diabetes   . Chronic kidney disease (CKD), stage III (moderate)   . Hyperlipidemia      No past surgical history on file.  No Known Allergies  History   Social History  . Marital Status: Single    Spouse Name: N/A    Number of Children: 0  . Years of Education: N/A    Occupational History  . Plant Worker    Social History Main Topics  . Smoking status: Former Smoker -- 1.00 packs/day for 20 years    Types: Cigarettes    Quit date: 06/21/2007  . Smokeless tobacco: Never Used  . Alcohol Use: No  . Drug Use: No  . Sexual Activity: No   Other Topics Concern  . Not on file   Social History Narrative  . No narrative on file    Family History  Problem Relation Age of Onset  . Hypertension Mother   . Hypertension Father       Review of Systems  Constitutional: Positive for unexpected weight change. Negative for fever.  HENT: Positive for dental problem. Negative for congestion, ear pain, nosebleeds, postnasal drip, rhinorrhea, sinus pressure, sneezing, sore throat and trouble swallowing.   Eyes: Negative for redness and itching.  Respiratory: Positive for shortness of breath. Negative for cough, chest tightness and wheezing.   Cardiovascular: Positive for palpitations. Negative for leg swelling.  Gastrointestinal: Negative for nausea and vomiting.  Genitourinary: Negative for dysuria.  Musculoskeletal: Negative for joint swelling.  Skin: Negative for rash.  Neurological: Negative for headaches.  Hematological: Does not bruise/bleed easily.  Psychiatric/Behavioral: Positive for dysphoric mood. The patient is nervous/anxious.        Objective:   Physical Exam  Gen. Pleasant, obese, in no distress, normal affect ENT - no lesions, no post  nasal drip, class 2-3 airway Neck: No JVD, no thyromegaly, no carotid bruits Lungs: no use of accessory muscles, no dullness to percussion, decreased without rales or rhonchi  Cardiovascular: Rhythm regular, heart sounds  normal, no murmurs or gallops, no peripheral edema Abdomen: soft and non-tender, no hepatosplenomegaly, BS normal. Musculoskeletal: No deformities, no cyanosis or clubbing Neuro:  alert, non focal, no tremors       Assessment & Plan:

## 2014-01-17 NOTE — Assessment & Plan Note (Signed)
We will set you up with a new D[place] [facility] New full face mask & other CPAP supplies Turn in the chip so I can loook at report on pressure Then we can write a Rx for a new CPAP machine if needed   The pathophysiology of obstructive sleep apnea , it's cardiovascular consequences & modes of treatment including CPAP were discused with the patient in detail & they evidenced understanding. Weight loss encouraged, compliance with goal of at least 4-6 hrs every night is the expectation. Advised against medications with sedative side effects Cautioned against driving when sleepy - understanding that sleepiness will vary on a day to day basis

## 2014-01-17 NOTE — Patient Instructions (Signed)
We will set you up with a new [place] New full face mask & other CPAP supplies Turn in the chip so I can loook at report on pressure Then we can write a Rx for a new CPAP machine if needed

## 2014-01-24 ENCOUNTER — Telehealth: Payer: Self-pay | Admitting: Pulmonary Disease

## 2014-01-24 NOTE — Telephone Encounter (Signed)
[facility] to [facility] on status of his order. Will await response back.   Called pt to make aware we are going to see what is going on. LMTCB x1

## 2014-01-24 NOTE — Telephone Encounter (Signed)
Per Efraim Kaufmannmelissa: Hey Mindy. This is what we have documented for this patient:  The order was entered 8/3, patient was called 8/4 and was set up with supplies today   Thanks  Melissa  --  Called pt and LMTCB x1

## 2014-01-27 NOTE — Telephone Encounter (Signed)
ATC # in message, [employer] customer unavailable. I called home # and LM with pt mother to have pt call back. Carron CurieJennifer Castillo, CMA

## 2014-01-27 NOTE — Telephone Encounter (Signed)
Pt returned call.   Spoke with patient who reported that he has gotten his mask fitted and received his supplies.  The only thing left is his download.  Pt stated that when [facility] mask [facility], someone had offered to remove the chip to obtain the download but the rep was unsure if our office "wanted to go thru them or another company."  Advised pt that that statement was unlikely and we typically request the DL from the company maintaining the CPAP.  Pt stated he is able to take his machine back to the [facility] store to have this done.  Thanked pt for his assistance with this and advised will contact the [place] office to be expecting him sometime today.  Called the [facility] office and spoke with Albin FellingCarla in Customer Care.  Apprised her of the above, that there is an order in epic from 8.3.15, she placed me on hold and informed respiratory.    Nothing further needed at this time; will sign off.

## 2014-01-30 ENCOUNTER — Other Ambulatory Visit: Payer: Self-pay | Admitting: *Deleted

## 2014-01-30 MED ORDER — CARVEDILOL 12.5 MG PO TABS: 12.5000 mg | ORAL_TABLET | Freq: Two times a day (BID) | ORAL | Status: DC

## 2014-01-30 MED ORDER — VERAPAMIL HCL 120 MG PO TABS: 120.0000 mg | ORAL_TABLET | Freq: Two times a day (BID) | ORAL | Status: DC

## 2014-01-30 NOTE — Telephone Encounter (Signed)
Requested Prescriptions   Signed Prescriptions Disp Refills  . carvedilol (COREG) 12.5 MG tablet 180 tablet 3    Sig: Take 1 tablet (12.5 mg total) by mouth 2 (two) times daily.    Authorizing Provider: Antonieta IbaGOLLAN, TIMOTHY J    Ordering User: Kendrick FriesLOPEZ, MARINA C  . verapamil (CALAN) 120 MG tablet 180 tablet 3    Sig: Take 1 tablet (120 mg total) by mouth 2 (two) times daily.    Authorizing Provider: Antonieta IbaGOLLAN, TIMOTHY J    Ordering User: Kendrick FriesLOPEZ, MARINA C

## 2014-02-13 ENCOUNTER — Institutional Professional Consult (permissible substitution): Payer: BC Managed Care – PPO | Admitting: Pulmonary Disease

## 2014-03-03 ENCOUNTER — Telehealth: Payer: Self-pay | Admitting: Pulmonary Disease

## 2014-03-03 NOTE — Telephone Encounter (Signed)
Pt was seen by RA with the following instructions on 01/17/14:  Patient Instructions     We will set you up with a new DME in [place]  New full face mask & other CPAP supplies  Turn in the chip so I can loook at report on pressure  Then we can write a Rx for a new CPAP machine if needed  Follow up in 1 month  -------  Called, spoke with pt who reports he took chip to [facility] in [place] x 1 month ago and has yet to hear from them to pick chip up.   Called [facility], spoke with Reuel Boom.  Was advised typically pts do not receive the same card back.  They are either given a card at time of drop off or a new card is mailed to the pt.  Reuel Boom states pt can do either but would need to know his preference - advised I would have pt call them with this information. * Note: Reuel Boom will fax download to triage to ensure results are available for pt's appt tomorrow with RA.  Called, spoke with pt - Explained above per Reuel Boom to him.  He verbalized understanding and states he will call [facility] to arrange getting a new card.  Nothing further needed at this time.  Pt confirmed tomorrow's appt.

## 2014-03-03 NOTE — Telephone Encounter (Signed)
Pt returned triage's call & asks to be reached at his work, [phone] ext. [phone].  Antionette Fairy

## 2014-03-03 NOTE — Telephone Encounter (Signed)
Called home number provided-told by Lurena Joiner (patients mother and DPR signed to speak with her) stated that the patient just left for work. She is unsure of what patient is referring to with his call to [place]. I was given his cell number of [phone] to call and ask patient. I had to leave a message for patient to call us back so we may discuss his concerns.

## 2014-03-04 ENCOUNTER — Encounter: Payer: Self-pay | Admitting: *Deleted

## 2014-03-04 ENCOUNTER — Ambulatory Visit (INDEPENDENT_AMBULATORY_CARE_PROVIDER_SITE_OTHER): Payer: BC Managed Care – PPO | Admitting: Pulmonary Disease

## 2014-03-04 ENCOUNTER — Encounter: Payer: Self-pay | Admitting: Pulmonary Disease

## 2014-03-04 VITALS — BP 130/70 | HR 81

## 2014-03-04 DIAGNOSIS — Z9989 Dependence on other enabling machines and devices: Principal | ICD-10-CM

## 2014-03-04 DIAGNOSIS — E1165 Type 2 diabetes mellitus with hyperglycemia: Secondary | ICD-10-CM

## 2014-03-04 DIAGNOSIS — IMO0002 Reserved for concepts with insufficient information to code with codable children: Secondary | ICD-10-CM

## 2014-03-04 DIAGNOSIS — G4733 Obstructive sleep apnea (adult) (pediatric): Secondary | ICD-10-CM

## 2014-03-04 DIAGNOSIS — E118 Type 2 diabetes mellitus with unspecified complications: Secondary | ICD-10-CM

## 2014-03-04 NOTE — Patient Instructions (Signed)
CPAP is set at 15 cm CBG check You can see a podiatrist for foot spurs Flu shot

## 2014-03-04 NOTE — Progress Notes (Signed)
   Subjective:    Patient ID: Sean Buck, male    DOB: 10-06-1970, 43 y.o.   MRN: [id number]  HPI  43 year old obese man for FU of obstructive sleep apnea.  He has hypertension,CKD and diabetes-now diet controlled and LVH on echo.  He was admitted 08/2013 with hyperglycemia, hypertensive crisis. He requires 4 medications for blood pressure control, and has come off insulin for the past 2 months.  Reports OSA diagnosis in 2011 and was placed on CPAP 8-18 cm, nasal, but was poorly compliant with this prior to his hospital admission. Since then, he is using this on a nightly basis. He underwent UPPP in 2010. He reports extreme dryness of mouth it persists during the daytime and wonders if his medication is causing it. He also reports increased sluggishness due to his antihypertensives.  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 He quit smoking in 2008, about 20-pack-years. Spirometry did not show any obstruction, only showed moderate restriction due to obesity.  Overnight polysomnogram in 12/3013 showed moderate OSA with AHI of 19 per hour with severe desaturation to 78%.  he has gotten his mask fitted and received his supplies  Chief Complaint  Patient presents with  . Sleep Apnea    follow-up. Pt is wearing cpap every night. Pt is requesting a new machine. Pt also c/o numbness and tingling on left hand  x 1 day.    Takes prednisone for gout quite frequently Has not checked his sugars in a while -CBG 201  C/o foot spurs Got new mirage FF mask, no dryness  download 01/2014 - excellent compliance, AHI 1/h, no leak Wt unchanged, cannot exercise due to foot problems Bedtime is 10:30 to 11 PM, sleep latency is 30-60 minutes, he sleeps on his side with 2 pillows, reports 3-4 nocturnal awakenings and is out of bed at 4:30 AM with occasional headache and dryness of mouth. His wt was as high as 325 pounds and is now 317 pounds.     Review of  Systems neg for any significant sore throat, dysphagia, itching, sneezing, nasal congestion or excess/ purulent secretions, fever, chills, sweats, unintended wt loss, pleuritic or exertional cp, hempoptysis, orthopnea pnd or change in chronic leg swelling. Also denies presyncope, palpitations, heartburn, abdominal pain, nausea, vomiting, diarrhea or change in bowel or urinary habits, dysuria,hematuria, rash, arthralgias, visual complaints, headache, numbness weakness or ataxia.     Objective:   Physical Exam  Gen. Pleasant, obese, in no distress ENT - no lesions, no post nasal drip Neck: No JVD, no thyromegaly, no carotid bruits Lungs: no use of accessory muscles, no dullness to percussion, decreased without rales or rhonchi  Cardiovascular: Rhythm regular, heart sounds  normal, no murmurs or gallops, no peripheral edema Musculoskeletal: No deformities, no cyanosis or clubbing , no tremors       Assessment & Plan:

## 2014-03-04 NOTE — Telephone Encounter (Signed)
Received DL and placed in RA's lookat

## 2014-03-05 NOTE — Assessment & Plan Note (Signed)
CPAP very effective at 15 cm Weight loss encouraged, compliance with goal of at least 4-6 hrs every night is the expectation. Advised against medications with sedative side effects Cautioned against driving when sleepy - understanding that sleepiness will vary on a day to day basis

## 2014-03-05 NOTE — Assessment & Plan Note (Signed)
Flu shot today Encouraged himt o discuss options with PCP He has appt for rheum at [facility] for gout tomorrow

## 2014-04-06 ENCOUNTER — Inpatient Hospital Stay: Payer: Self-pay | Admitting: Internal Medicine

## 2014-05-09 ENCOUNTER — Other Ambulatory Visit: Payer: Self-pay | Admitting: Cardiovascular Disease

## 2014-05-25 ENCOUNTER — Inpatient Hospital Stay: Payer: Self-pay | Admitting: Internal Medicine

## 2014-07-15 IMAGING — CR DG FOOT COMPLETE 3+V*L*
1 series · 3 of 3 positions shown · non-contrast
Comparison: none

REASON FOR EXAM: heel spur pain
COMMENTS:   LMP: (Male)

PROCEDURE:     DXR - DXR FOOT LT COMP W/OBLIQUES  - August 21, 2012  [DATE]
RESULT:     Comparison:  None

[Series 1: x foot ap left · 0.14mm/px · 3 of 3 slices shown]
[im 1/3]
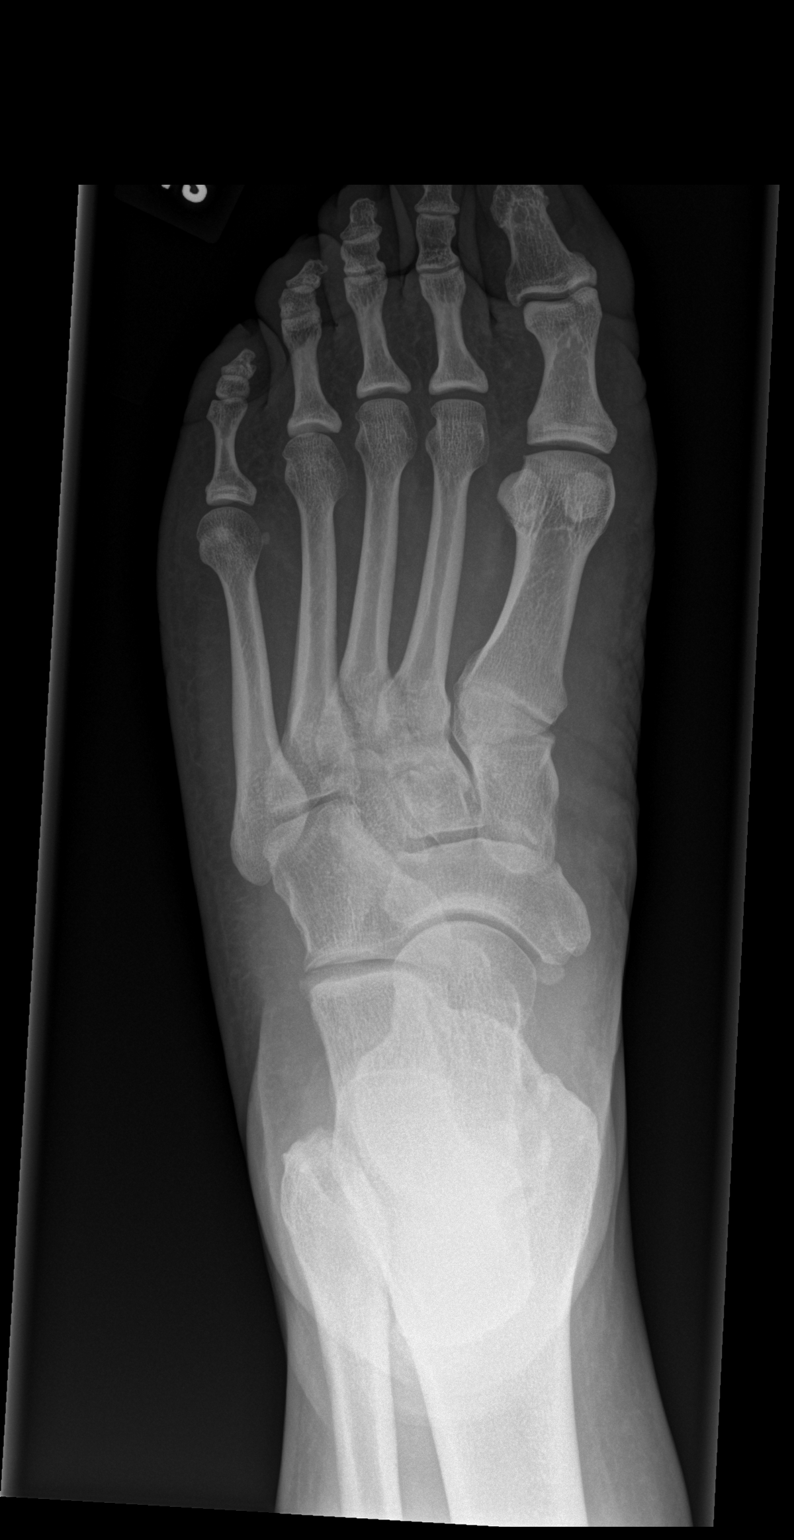
[im 2/3]
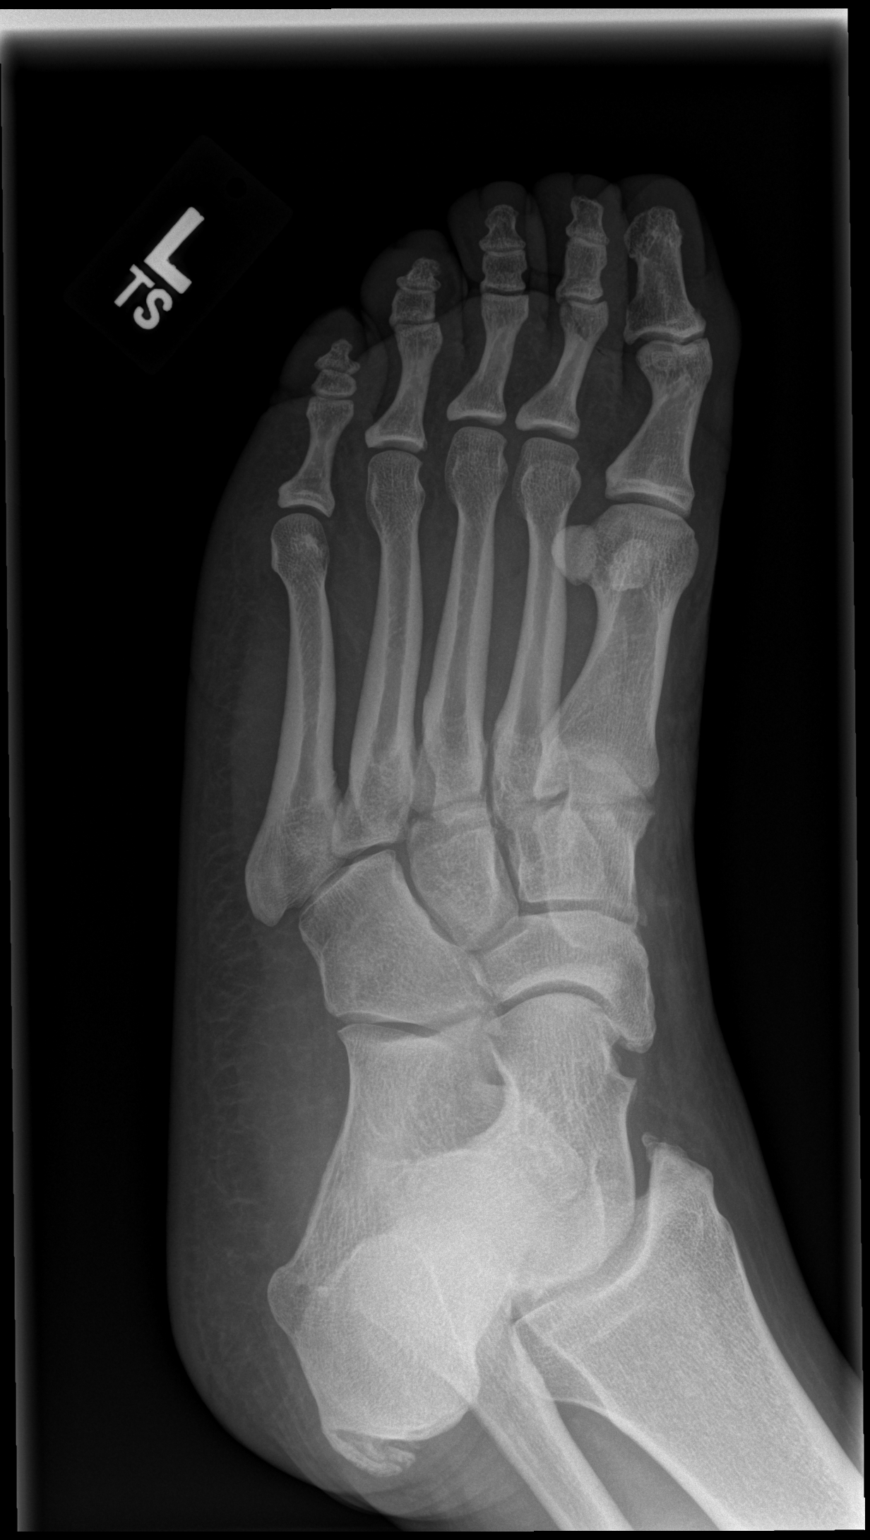
[im 3/3]
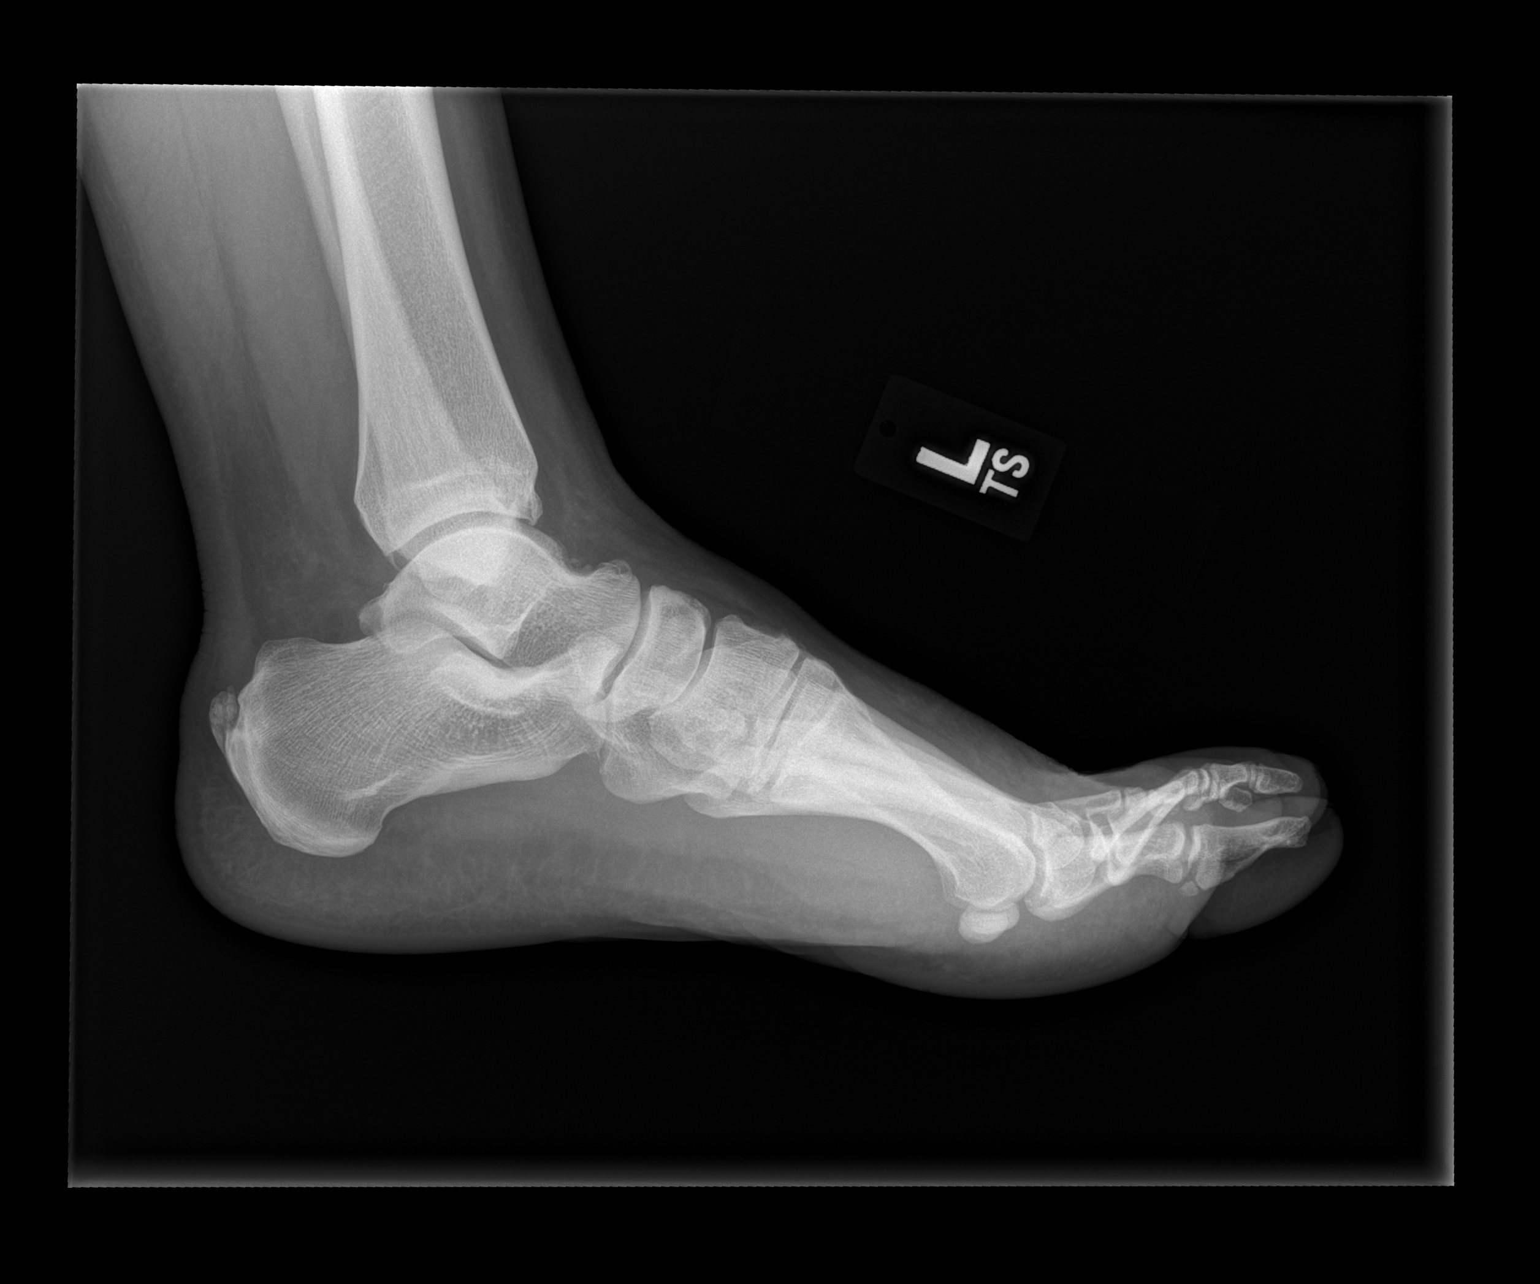

[3 of 3 positions shown; findings below may reference images not displayed]

FINDINGS: AP, oblique, and lateral views of the left foot demonstrates no fracture or
dislocation. There is no soft tissue abnormality. There is no subcutaneous
emphysema or radiopaque foreign bodies.
IMPRESSION: No acute osseous injury of the left foot.

[REDACTED]

## 2014-07-16 ENCOUNTER — Other Ambulatory Visit: Payer: Self-pay

## 2014-07-16 ENCOUNTER — Encounter: Payer: Self-pay | Admitting: Cardiovascular Disease

## 2014-07-16 DIAGNOSIS — I10 Essential (primary) hypertension: Secondary | ICD-10-CM

## 2014-07-16 DIAGNOSIS — I469 Cardiac arrest, cause unspecified: Secondary | ICD-10-CM

## 2014-07-16 NOTE — Telephone Encounter (Signed)
This encounter was created in error - please disregard.

## 2014-07-21 DEATH — deceased

## 2015-08-16 IMAGING — CR DG CHEST 1V PORT
1 series · 1 of 1 positions shown · non-contrast
Comparison: DG CHEST 1V PORT dated 09/13/2013

CLINICAL DATA: Shortness of breath.

EXAM:
PORTABLE CHEST - 1 VIEW

[ap]
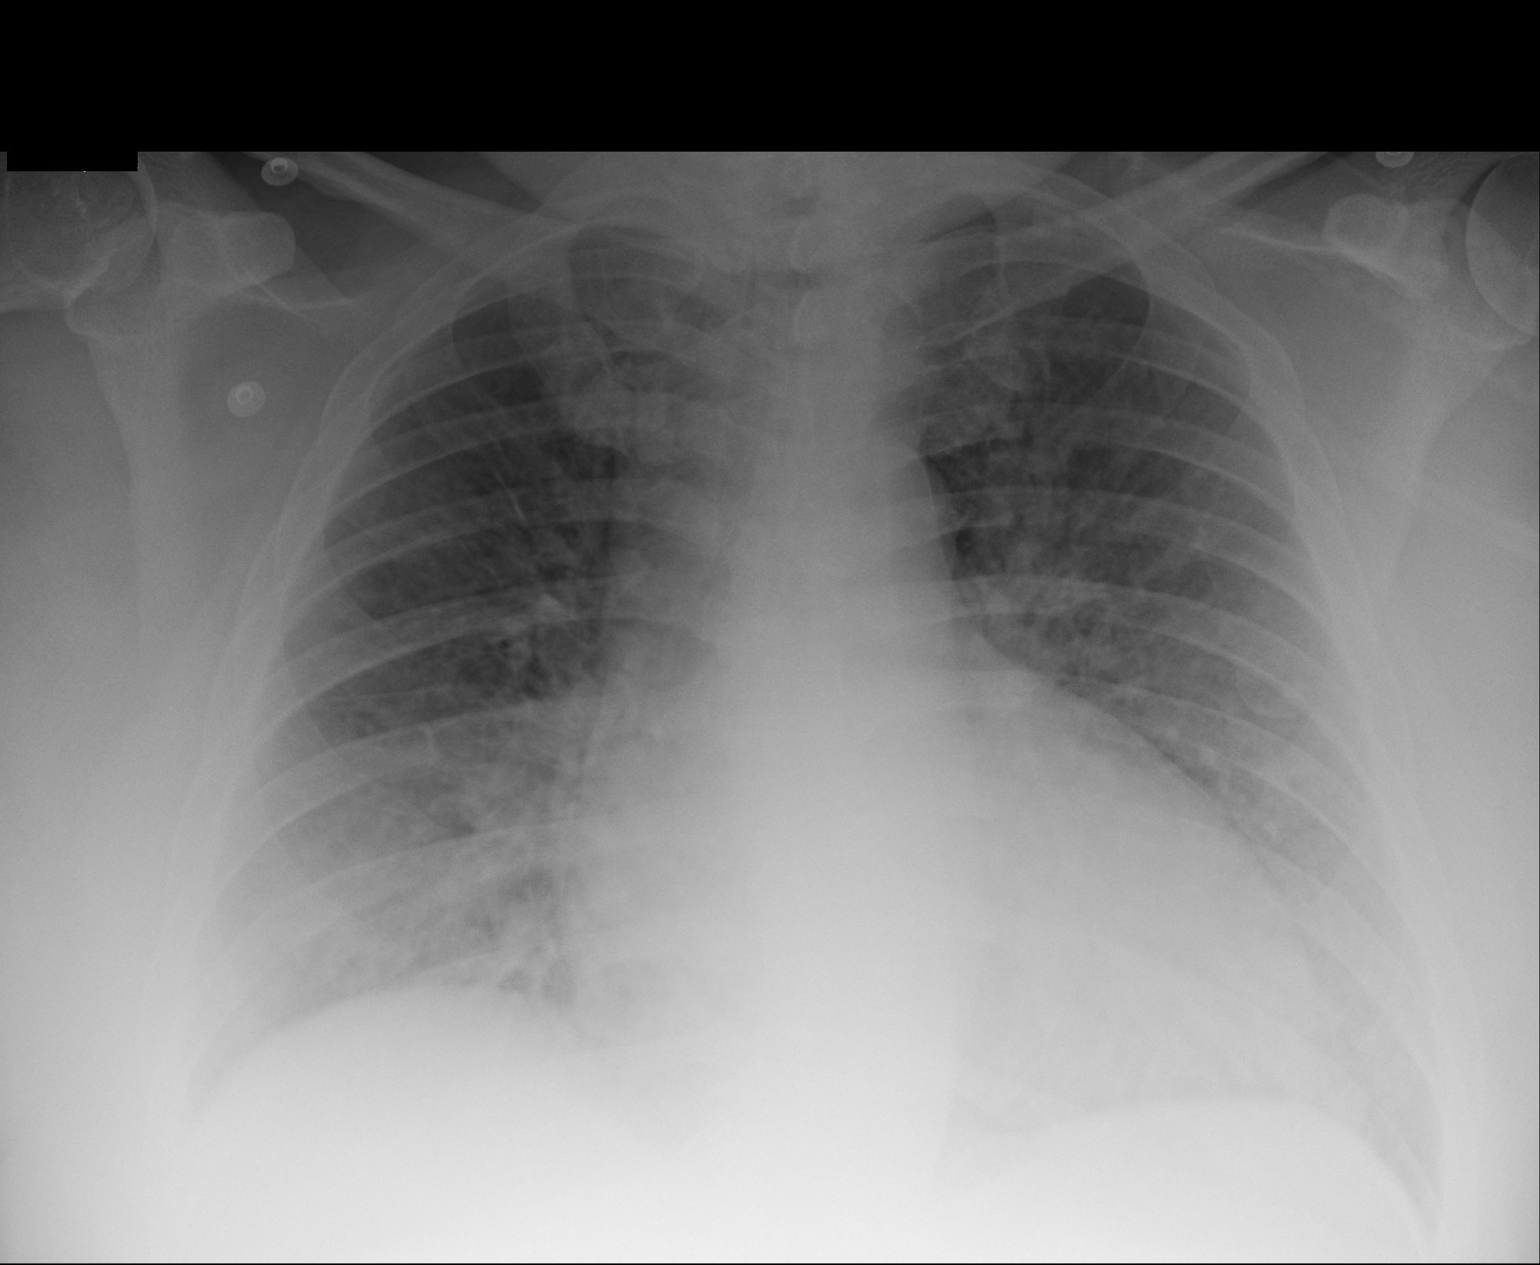

[1 of 1 positions shown; findings below may reference images not displayed]

FINDINGS: Cardiomegaly would pulmonary vascular prominence and bilateral
pulmonary infiltrates consistent with congestive heart failure and
pulmonary edema. No pleural effusion or pneumothorax. No acute bony
abnormality.
IMPRESSION: Congestive heart failure with pulmonary edema. These findings are
new from prior study 09/13/2013.
